# Patient Record
Sex: Female | Born: 1985
Health system: Southern US, Community
[De-identification: ages and names within clinical notes are randomized; demographics above are authoritative.]

## PROBLEM LIST (undated history)

## (undated) DIAGNOSIS — I8390 Asymptomatic varicose veins of unspecified lower extremity: Secondary | ICD-10-CM

## (undated) DIAGNOSIS — Z789 Other specified health status: Secondary | ICD-10-CM

## (undated) HISTORY — DX: Asymptomatic varicose veins of unspecified lower extremity: I83.90

## (undated) HISTORY — PX: VARICOSE VEIN SURGERY: SHX832

## (undated) HISTORY — PX: CHOLECYSTECTOMY: SHX55

---

## 2009-07-05 ENCOUNTER — Ambulatory Visit (HOSPITAL_COMMUNITY): Admission: RE | Admit: 2009-07-05 | Discharge: 2009-07-05 | Payer: Self-pay | Admitting: Obstetrics & Gynecology

## 2009-11-06 ENCOUNTER — Ambulatory Visit: Payer: Self-pay | Admitting: Obstetrics and Gynecology

## 2009-11-06 ENCOUNTER — Inpatient Hospital Stay (HOSPITAL_COMMUNITY): Admission: AD | Admit: 2009-11-06 | Discharge: 2009-11-08 | Payer: Self-pay | Admitting: Obstetrics & Gynecology

## 2010-03-06 ENCOUNTER — Emergency Department (HOSPITAL_COMMUNITY): Admission: EM | Admit: 2010-03-06 | Discharge: 2010-03-07 | Payer: Self-pay | Admitting: Emergency Medicine

## 2010-03-08 ENCOUNTER — Ambulatory Visit: Payer: Self-pay | Admitting: Internal Medicine

## 2010-03-08 ENCOUNTER — Observation Stay (HOSPITAL_COMMUNITY): Admission: EM | Admit: 2010-03-08 | Discharge: 2010-03-11 | Payer: Self-pay | Admitting: Emergency Medicine

## 2010-03-08 ENCOUNTER — Encounter (INDEPENDENT_AMBULATORY_CARE_PROVIDER_SITE_OTHER): Payer: Self-pay

## 2011-03-12 LAB — URINALYSIS, ROUTINE W REFLEX MICROSCOPIC
Bilirubin Urine: NEGATIVE
Glucose, UA: NEGATIVE mg/dL
Hgb urine dipstick: NEGATIVE
Ketones, ur: NEGATIVE mg/dL
Nitrite: NEGATIVE
Protein, ur: NEGATIVE mg/dL
Protein, ur: NEGATIVE mg/dL
Specific Gravity, Urine: 1.004 — ABNORMAL LOW (ref 1.005–1.030)
Urobilinogen, UA: 1 mg/dL (ref 0.0–1.0)

## 2011-03-12 LAB — COMPREHENSIVE METABOLIC PANEL
ALT: 152 U/L — ABNORMAL HIGH (ref 0–35)
ALT: 506 U/L — ABNORMAL HIGH (ref 0–35)
AST: 301 U/L — ABNORMAL HIGH (ref 0–37)
Albumin: 3.8 g/dL (ref 3.5–5.2)
Alkaline Phosphatase: 212 U/L — ABNORMAL HIGH (ref 39–117)
Alkaline Phosphatase: 91 U/L (ref 39–117)
BUN: 10 mg/dL (ref 6–23)
CO2: 30 mEq/L (ref 19–32)
Chloride: 106 mEq/L (ref 96–112)
Chloride: 109 mEq/L (ref 96–112)
Creatinine, Ser: 0.9 mg/dL (ref 0.4–1.2)
GFR calc Af Amer: >60 mL/min (ref >60)
GFR calc non Af Amer: >60 mL/min (ref >60)
GFR calc non Af Amer: >60 mL/min (ref >60)
Glucose, Bld: 144 mg/dL — ABNORMAL HIGH (ref 70–99)
Glucose, Bld: 85 mg/dL (ref 70–99)
Potassium: 3.5 mEq/L (ref 3.5–5.1)
Potassium: 3.8 mEq/L (ref 3.5–5.1)
Sodium: 138 mEq/L (ref 135–145)
Sodium: 142 mEq/L (ref 135–145)
Total Bilirubin: 0.7 mg/dL (ref 0.3–1.2)
Total Bilirubin: 0.7 mg/dL (ref 0.3–1.2)
Total Protein: 6.2 g/dL (ref 6.0–8.3)

## 2011-03-12 LAB — POCT PREGNANCY, URINE: Preg Test, Ur: NEGATIVE

## 2011-03-12 LAB — CBC
HCT: 34.4 % — ABNORMAL LOW (ref 36.0–46.0)
HCT: 35.1 % — ABNORMAL LOW (ref 36.0–46.0)
HCT: 37.4 % (ref 36.0–46.0)
Hemoglobin: 11.6 g/dL — ABNORMAL LOW (ref 12.0–15.0)
Hemoglobin: 12.9 g/dL (ref 12.0–15.0)
MCHC: 32.3 g/dL (ref 30.0–36.0)
MCHC: 34.4 g/dL (ref 30.0–36.0)
MCHC: 34.5 g/dL (ref 30.0–36.0)
MCV: 83.3 fL (ref 78.0–100.0)
MCV: 84.1 fL (ref 78.0–100.0)
Platelets: 133 10*3/uL — ABNORMAL LOW (ref 150–400)
RBC: 4.06 MIL/uL (ref 3.87–5.11)
RDW: 14.1 % (ref 11.5–15.5)
RDW: 14.7 % (ref 11.5–15.5)
WBC: 7.1 10*3/uL (ref 4.0–10.5)
WBC: 7.3 10*3/uL (ref 4.0–10.5)

## 2011-03-12 LAB — HEPATIC FUNCTION PANEL
AST: 248 U/L — ABNORMAL HIGH (ref 0–37)
AST: 63 U/L — ABNORMAL HIGH (ref 0–37)
Bilirubin, Direct: 0.2 mg/dL (ref 0.0–0.3)
Bilirubin, Direct: 0.3 mg/dL (ref 0.0–0.3)
Bilirubin, Direct: 2.2 mg/dL — ABNORMAL HIGH (ref 0.0–0.3)
Indirect Bilirubin: 1.3 mg/dL — ABNORMAL HIGH (ref 0.3–0.9)
Total Protein: 6.4 g/dL (ref 6.0–8.3)
Total Protein: 6.5 g/dL (ref 6.0–8.3)

## 2011-03-12 LAB — DIFFERENTIAL
Basophils Absolute: 0 10*3/uL (ref 0.0–0.1)
Basophils Relative: 0 % (ref 0–1)
Eosinophils Absolute: 0 10*3/uL (ref 0.0–0.7)
Eosinophils Relative: 1 % (ref 0–5)
Lymphs Abs: 2.5 10*3/uL (ref 0.7–4.0)
Monocytes Absolute: 0.3 10*3/uL (ref 0.1–1.0)
Monocytes Relative: 4 % (ref 3–12)
Monocytes Relative: 4 % (ref 3–12)

## 2011-03-12 LAB — POCT I-STAT, CHEM 8
BUN: 10 mg/dL (ref 6–23)
Calcium, Ion: 1.14 mmol/L (ref 1.12–1.32)
HCT: 36 % (ref 36.0–46.0)
TCO2: 24 mmol/L (ref 0–100)

## 2011-03-12 LAB — PROTIME-INR: Prothrombin Time: 14.5 seconds (ref 11.6–15.2)

## 2011-03-12 LAB — CREATININE, SERUM
Creatinine, Ser: 0.61 mg/dL (ref 0.4–1.2)
GFR calc non Af Amer: >60 mL/min (ref >60)

## 2011-03-12 LAB — LIPASE, BLOOD
Lipase: 24 U/L (ref 11–59)
Lipase: 39 U/L (ref 11–59)

## 2011-03-12 LAB — URINE MICROSCOPIC-ADD ON

## 2011-03-21 LAB — RPR: RPR Ser Ql: NONREACTIVE

## 2011-03-21 LAB — CBC
Hemoglobin: 13.6 g/dL (ref 12.0–15.0)
MCV: 84.3 fL (ref 78.0–100.0)
RBC: 4.68 MIL/uL (ref 3.87–5.11)
WBC: 10.2 10*3/uL (ref 4.0–10.5)

## 2014-02-17 ENCOUNTER — Encounter: Payer: Self-pay | Admitting: *Deleted

## 2014-03-25 ENCOUNTER — Encounter: Payer: Self-pay | Admitting: Obstetrics & Gynecology

## 2014-03-25 ENCOUNTER — Ambulatory Visit (INDEPENDENT_AMBULATORY_CARE_PROVIDER_SITE_OTHER): Payer: Self-pay | Admitting: Obstetrics & Gynecology

## 2014-03-25 VITALS — BP 114/78 | HR 75 | Temp 97.9°F | Ht 59.0 in | Wt 132.6 lb

## 2014-03-25 DIAGNOSIS — IMO0002 Reserved for concepts with insufficient information to code with codable children: Secondary | ICD-10-CM

## 2014-03-25 DIAGNOSIS — N898 Other specified noninflammatory disorders of vagina: Secondary | ICD-10-CM | POA: Insufficient documentation

## 2014-03-25 LAB — POCT URINALYSIS DIP (DEVICE)
BILIRUBIN URINE: NEGATIVE
Glucose, UA: NEGATIVE mg/dL
Ketones, ur: NEGATIVE mg/dL
LEUKOCYTES UA: NEGATIVE
NITRITE: NEGATIVE
PH: 5.5 (ref 5.0–8.0)
Protein, ur: NEGATIVE mg/dL
Specific Gravity, Urine: <=1.005 (ref 1.005–1.030)
UROBILINOGEN UA: 0.2 mg/dL (ref 0.0–1.0)

## 2014-03-25 NOTE — Patient Instructions (Signed)
Dispareunia  (Dyspareunia) La dispareunia es el dolor durante las relaciones sexuales. Es ms comn en las mujeres, pero tambin ocurre en los hombres.  CAUSAS  Femenino El dolor se siente cuando algo penetra en la vagina, pero cualquier parte de los genitales pueden causar dolor durante las relaciones sexuales. Puede sentir dolor incluso al sentarse o usar pantalones. En algunos casos no se conoce la causa. Algunas causas:   Infecciones de la piel que rodea la vagina.  Las infecciones vaginales, tales como hongos, bacterias o infeccin viral.  Vaginismo. Es la imposibilidad de tener algo en la vagina, incluso queriendo hacerlo. Hay una contraccin muscular automtica y dolor. El dolor de la contraccin muscular puede ser tan intenso que el coito es imposible.  Reaccin alrgica a los espermicidas, semen, preservativos, tampones perfumados, jabones, duchas y aerosoles vaginales.  Un saco lleno de lquido (quiste) en las glndulas de Bartolino o de Skene, que se encuentra en la apertura de la vagina.  Tejido cicatrizal en la vagina por un agrandamiento quirrgico de la abertura (episiotoma) o desgarro despus de tener un beb.  Sequedad vaginal. Esto es ms frecuente en la menopausia. Las secreciones normales de la vagina estn disminuidas. Los cambios en los niveles de estrgeno y la mayor dificultad para excitarse pueden causar dolor durante el sexo. La sequedad vaginal tambin puede ocurrir cuando se toman pldoras anticonceptivas.  Adelgazamiento del tejido (atrofia) de la vulva y la vagina. Esto hace que la zona sea ms delgada, ms pequea, incapaz de estirarse para acomodar el pene, y propensa a infecciones y desgarro.  Vestibulitis vulvar o vestibulodinia. Esta es una enfermedad que causa dolor y afecta el rea alrededor de la entrada de la vagina. La causa ms comn en las mujeres jvenes son las pldoras anticonceptivas. Las mujeres con niveles bajos de estrgenos (mujeres  posmenopusicas) tambin pueden experimentarlo.Otras causas incluyen reacciones alrgicas, gran cantidad de terminaciones nerviosas, afecciones de la piel y msculos plvicos que no pueden relajarse.  Dermatosis vulvar. Esto incluye enfermedades de la piel como el liquen escleroso y liquen plano.  Falta de juegos previos para la lubricacin de la vagina. Esto puede causar sequedad vaginal.  Los tumores no cancerosos (fibromas) en el tero.  El tejido que reviste internamente al tero se desarrolla fuera del mismo (endometriosis).  Embarazo que comienza en la trompa de Falopio (embarazo tubrico).  El embarazo o estar amamantando al beb. Esto puede causar sequedad vaginal.  Inclinacin o prolapso del tero. El prolapso se produce cuando los msculos dbiles y estirados alrededor del tero dejan que caiga en la vagina.  Problemas en los ovarios, quistes o cicatrices. Esto puede ser peor con ciertas posiciones sexuales.  Cirugas previas causando adherencias o tejido cicatrizal en la vagina o la pelvis.  Problemas en la vejiga e intestinales.  Problemas psicolgicos (como depresin o ansiedad). Esto puede hacer que el dolor empeore.  Actitudes negativas con respecto al sexo, haber sufrido una violacin, agresin sexual, y desinformacin sobre el sexo. Estos problemas suelen estar relacionados con algunos tipos de dolor.  Infeccin plvica previa, causando un tejido cicatrizal en la pelvis y en los rganos femeninos.  Quiste o tumor en el ovario.  Cncer en los rganos femeninos.  Ciertos medicamentos.  Enfermedades como diabetes, artritis, o enfermedad de la tiroides. Masculino En los hombres, hay muchas causas fsicas del malestar sexual. Algunas causas:   Infecciones de la prstata, la vejiga, o las vesculas seminales. Pueden causar dolor despus de la eyaculacin.  Inflamacin de la vejiga (cistitis   intersticial). Puede causar dolor durante la eyaculacin.  Infecciones  por gonorrea. Puede causar dolor durante la eyaculacin.  Inflamacin de la uretra (uretritis) o inflamacin de la prstata (prostatitis) . Puede hacer que la estimulacin genital sea dolorosa o incmoda.  Deformidades del pene como la enfermedad de Peyronie.  Un prepucio estrecho.  Cncer en los rganos reproductivos masculinos.  Problemas psiclogicos. Esto puede hacer que el dolor empeore. DIAGNSTICO   El mdico har la historia clnica y tendr que describir el lugar donde se localiza el dolor (fuera de la vagina, en la vagina, en la pelvis). Le preguntar en qu momento experimenta el dolor, durante la penetracin o con los choques.  Luego el mdico le har un examen fsico. Hgale saber si sufre dolor durante el examen.  Durante la parte final del examen femenino, su mdico le palpar el tero y los ovarios con la mano sobre el abdomen y un dedo en la vagina. Es un examen plvico.  Le pedir anlisis de sangre, un Papanicolaou, cultivos en busca de infecciones, una ecografa y radiografas. Es posible que necesite ver a un especialista por problemas femeninos (gineclogo).  Su mdico indicar que se haga una tomografa computada, una resonancia magntica o una laparoscopia. La laparoscopia es un procedimiento para examinar la pelvis con un tubo con luz, a travs de un corte (incisin) en el abdomen. TRATAMIENTO  Su mdico determinar el mejor curso de tratamiento. En algunos casos se solicitan ms pruebas. Contine con los estudios indicados hasta que el mdico est seguro acerca del diagnstico y el tratamiento. A veces, es difcil de encontrar la causa del dolor. La bsqueda de la causa y el tratamiento pueden ser frustrantes. El tratamiento generalmente demora varias semanas o meses antes de notar alguna mejora. Tambin puede ser necesario evitar la actividad sexual hasta que los sntomas mejoren. Si sigue teniendo relaciones sexuales cuando tiene dolor puede retrasar la curacin y  empeorar el problema.  El tratamiento depende de la causa del dolor. Puede incluir:   Medicamentos como antibiticos, cremas vaginales, para la piel, hormonas, o antidepresivos.  Ciruga mayor o menor.  El asesoramiento psicolgico o terapia de grupo.  Ejercicios de Kegel y dilatadores vaginales para ayudar en ciertos casos de vaginismo (espasmos). Hgalos slo si se lo recomienda su mdico. Los ejercicios Kegel pueden hacer que algunos problemas empeoren.  La aplicacin de un lubricante segn las indicaciones del mdico si tiene sequedad.  Terapia sexual para usted y su compaero. Es comn que el dolor contine despus de tratar la causa. Puede ser por una respuesta condicionada. Esto significa la persona se vuelve tan familiar con el dolor que persiste como respuesta, aunque se elimine la causa. La terapia sexual puede ayudar con este problema.  INSTRUCCIONES PARA EL CUIDADO EN EL HOGAR   Siga las instrucciones de su mdico acerca de como tomar los medicamentos, las pruebas, terapias y visitas de control.  No use tampones, duchas vaginales , aerosoles vaginales o jabones perfumados.  Use lubricantes a base de agua para la sequedad. Los lubricantes con aceites pueden causar irritacin.  No utilice espermicidas o condones que le irriten.  Hable abiertamente con su pareja sobre su experiencia sexual , sus deseos, el juego previo y las diferentes posiciones sexuales para una relacin sexual ms cmoda y agradable.  nase a sesiones de terapia de grupo, si es necesario.  Practique sexo seguro en todo momento.  Vace su vejiga antes de tener relaciones sexuales.  Pruebe diferentes posiciones durante el coito.  Tome   medicamentos de venta libre para el dolor segn las indicaciones del mdico, antes de tener relaciones sexuales.  No use panties. Use las medias que llegan a la altura de la rodilla o del muslo.  Evite frotar la vulva con un pao. Lave suavemente la zona y squela con  una toalla dando golpecitos. SOLICITE ATENCIN MDICA SI:   Tiene hemorragias durante las relaciones sexuales.  Observa un bulto en la abertura de la vagina, aunque no sea doloroso.  Tiene flujo vaginal anormal.  Tiene sequedad vaginal.  Siente tiene picazn o irritacin de la vulva o la vagina.  Aparece una erupcin o tiene una reaccin a los medicamentos. SOLICITE ATENCIN MDICA DE INMEDIATO SI:   Siente dolor abdominal intenso durante o poco despus de las relaciones sexuales. Usted podra haber sufrido la ruptura de un quiste ovrico o un embarazo ectpico.  Tiene fiebre.  Comienza a sentir dolor al orinar u observa sangre.  Tiene relaciones sexuales dolorosas, y nunca le ocurri antes.  Se desmaya despus de tener relaciones sexuales. Document Released: 08/15/2011 Document Revised: 02/25/2012 ExitCare Patient Information 2014 Exit[employer].  

## 2014-03-25 NOTE — Progress Notes (Signed)
Patient ID: Linda DikesAnalberta Duncan, female   DOB: [birth year], 28 y.o.   MRN: [id number]  Chief Complaint  Patient presents with  . [facility]; everything started 6 months ago   . Dysuria  . Urinary Retention    difficult to urinate at times  . Dyspareunia    sometimes    HPI Linda Duncan is a 28 y.o. female.  [id number] Patient's last menstrual period was 03/22/2014. [facility], frequency with negative urine, vaginal cyst 1.5 cm seen on exam at [facility].  HPI  History reviewed. No pertinent past medical history.  Past Surgical History  Procedure Laterality Date  . Cholecystectomy      History reviewed. No pertinent family history.  Social History History  Substance Use Topics  . Smoking status: Never Smoker   . Smokeless tobacco: Never Used  . Alcohol Use: No    No Known Allergies  No current outpatient prescriptions on file.   No current facility-administered medications for this visit.    Review of Systems Review of Systems  Constitutional: Negative.   Genitourinary: Positive for dysuria, frequency, vaginal bleeding, vaginal pain and dyspareunia. Negative for vaginal discharge.    Blood pressure 114/78, pulse 75, temperature 97.9 F (36.6 C), temperature source Oral, height 4\' 11"  (1.499 m), weight 132 lb 9.6 oz (60.147 kg), last menstrual period 03/22/2014.  Physical Exam Physical Exam  Constitutional: She is oriented to person, place, and time. She appears well-developed. No distress.  Pulmonary/Chest: Effort normal. No respiratory distress.  Genitourinary: No vaginal discharge found.  Uterus retroverted mild tender no mass, 1.5 cm anterior vaginal inclusion cyst sl tender  Neurological: She is alert and oriented to person, place, and time.  Skin: Skin is warm and dry.  Psychiatric: She has a normal mood and affect. Her behavior is normal.    Data Reviewed Referral note  Assessment    Patient Active Problem  List   Diagnosis Date Noted  . Vaginal inclusion cyst 03/25/2014  . Dyspareunia 03/25/2014        Plan    Pelvic US RTC to review and consider I&D cyst with local and cautery AgNO3        Linda Duncan 03/25/2014, 4:26 PM

## 2014-03-31 ENCOUNTER — Ambulatory Visit (HOSPITAL_COMMUNITY)
Admission: RE | Admit: 2014-03-31 | Discharge: 2014-03-31 | Disposition: A | Discharge status: Home / Self Care | Payer: Self-pay | Source: Ambulatory Visit | Attending: Obstetrics & Gynecology | Admitting: Obstetrics & Gynecology

## 2014-03-31 DIAGNOSIS — N750 Cyst of Bartholin's gland: Secondary | ICD-10-CM | POA: Insufficient documentation

## 2014-03-31 DIAGNOSIS — N898 Other specified noninflammatory disorders of vagina: Secondary | ICD-10-CM

## 2014-03-31 DIAGNOSIS — IMO0002 Reserved for concepts with insufficient information to code with codable children: Secondary | ICD-10-CM | POA: Insufficient documentation

## 2014-03-31 DIAGNOSIS — N9489 Other specified conditions associated with female genital organs and menstrual cycle: Secondary | ICD-10-CM | POA: Insufficient documentation

## 2014-04-19 ENCOUNTER — Ambulatory Visit: Payer: Self-pay | Attending: Internal Medicine

## 2014-04-24 IMAGING — US US TRANSVAGINAL NON-OB
1 series · 14 of 25 positions shown · non-contrast
Comparison: None

CLINICAL DATA: Dyspareunia



[Series 1: us pelvis complete · 14 of 63 slices shown]
[im 1/63]
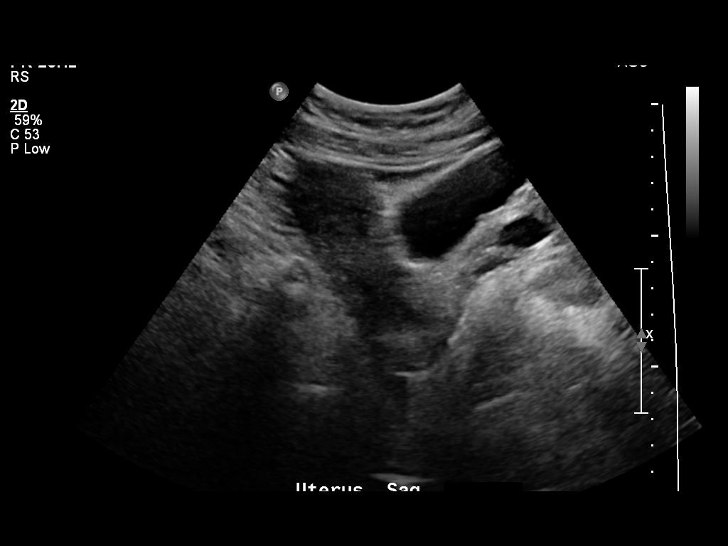
[im 6/63]
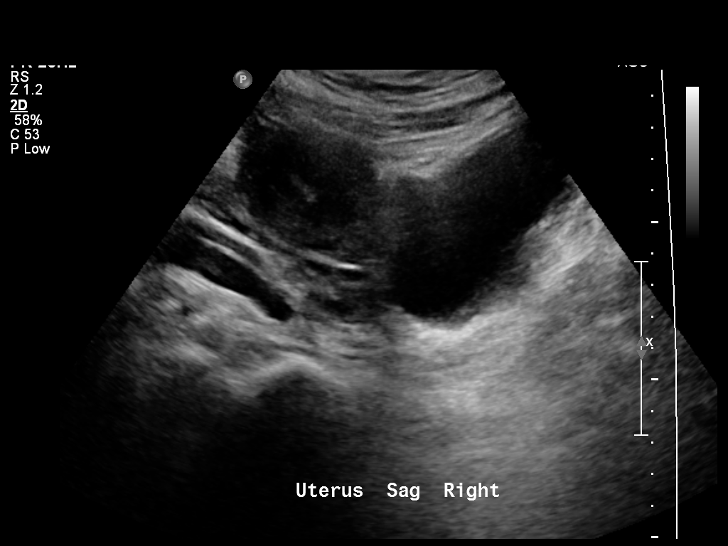
[im 11/63]
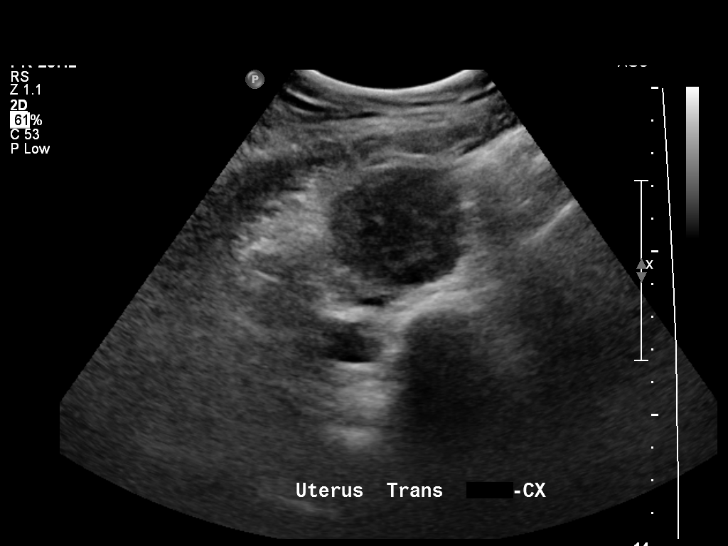
[im 16/63]
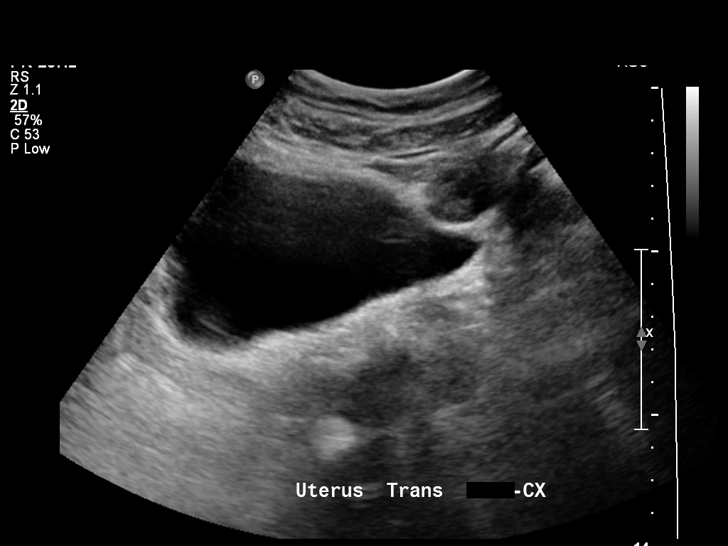
[im 21/63]
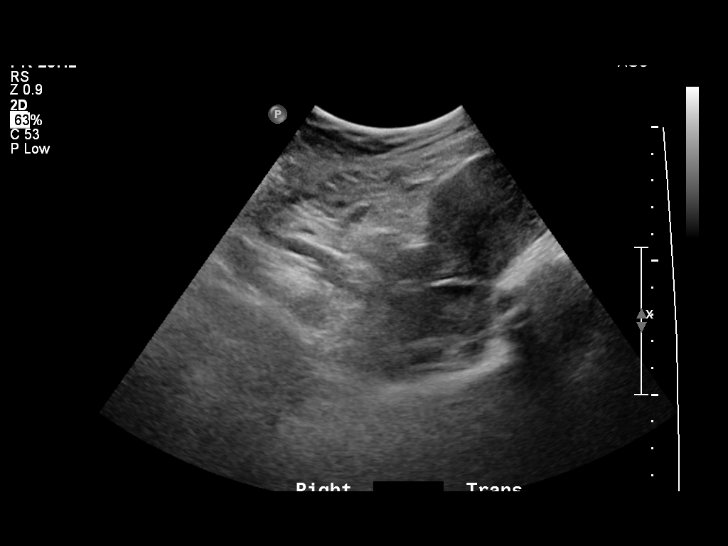
[im 24/63]
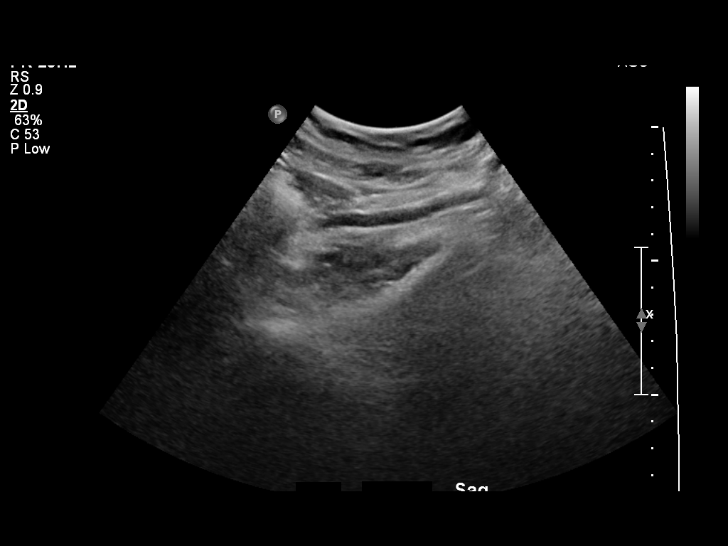
[im 29/63]
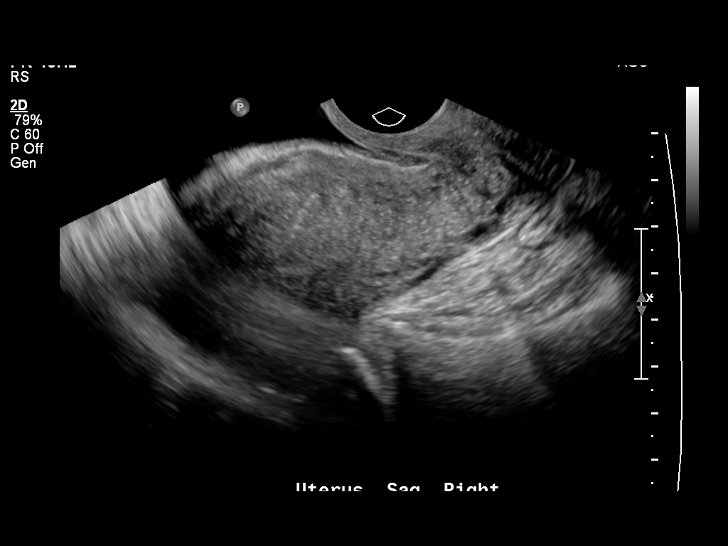
[im 34/63]
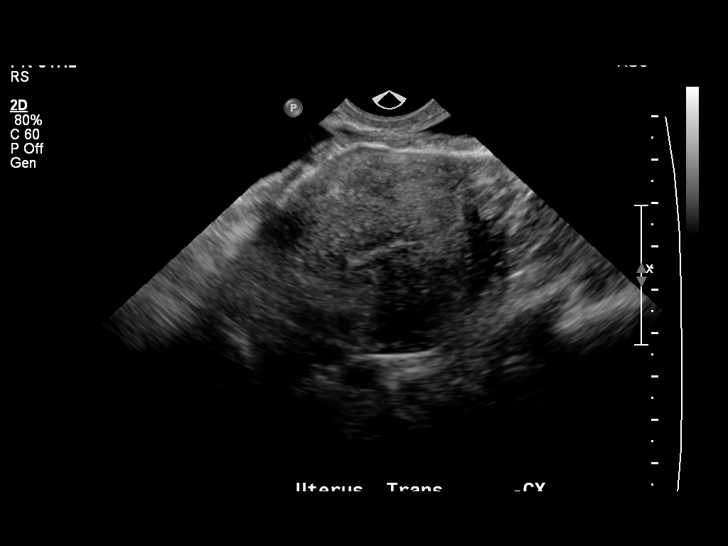
[im 39/63]
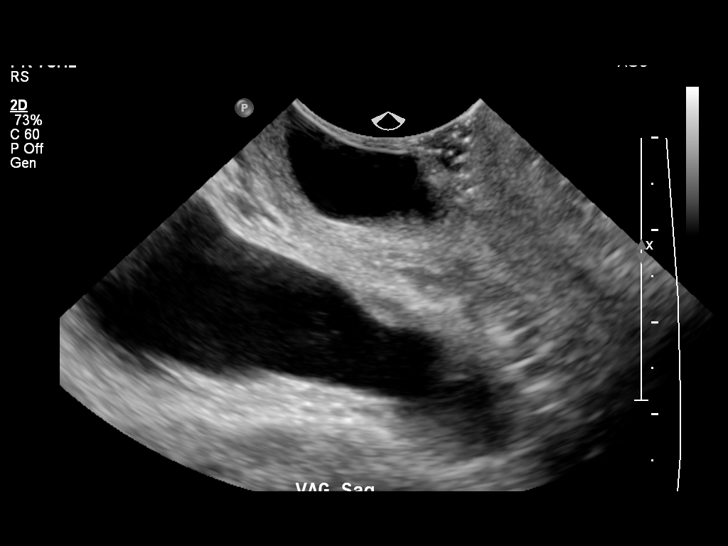
[im 42/63]
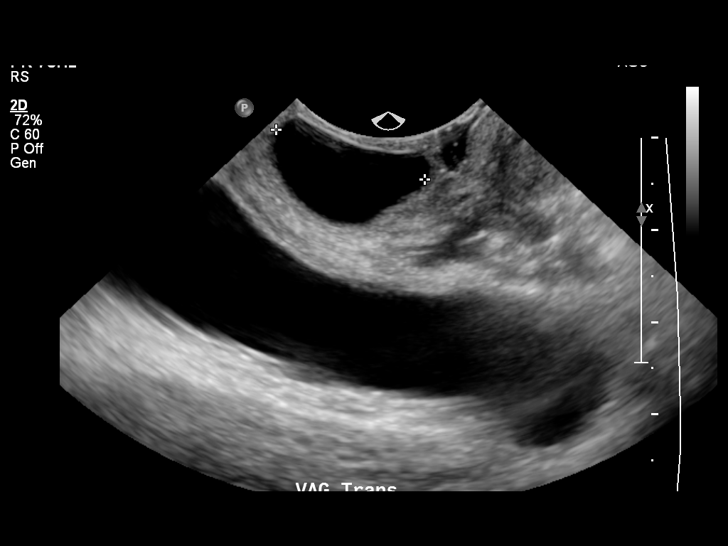
[im 47/63]
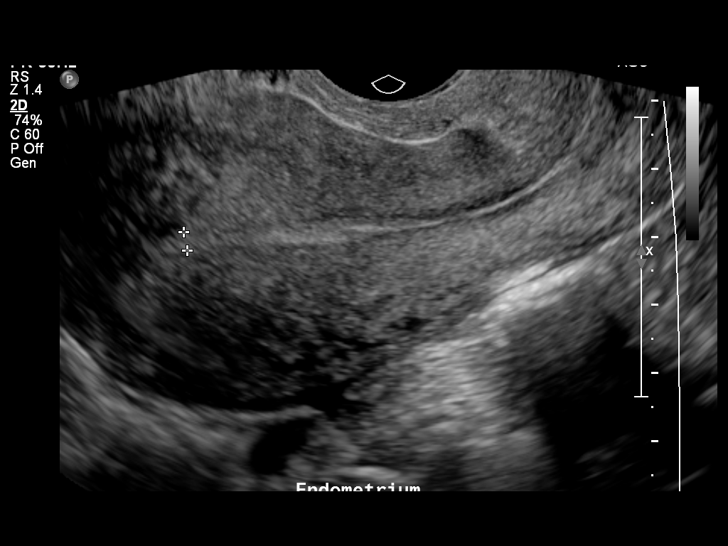
[im 52/63]
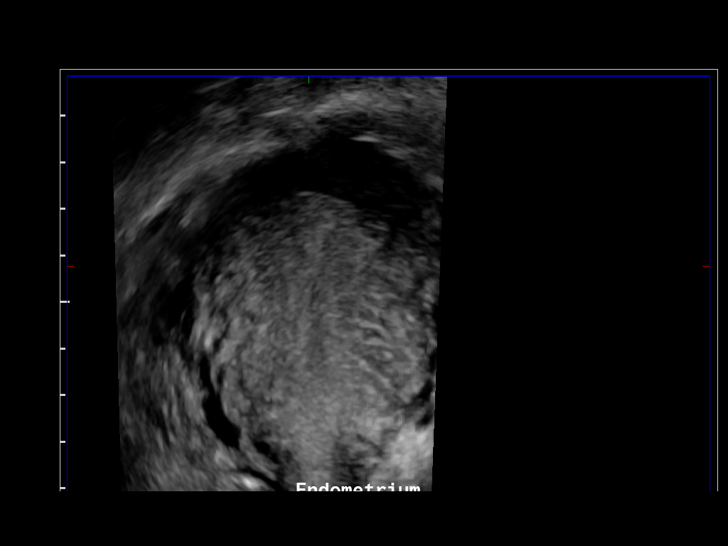
[im 57/63]
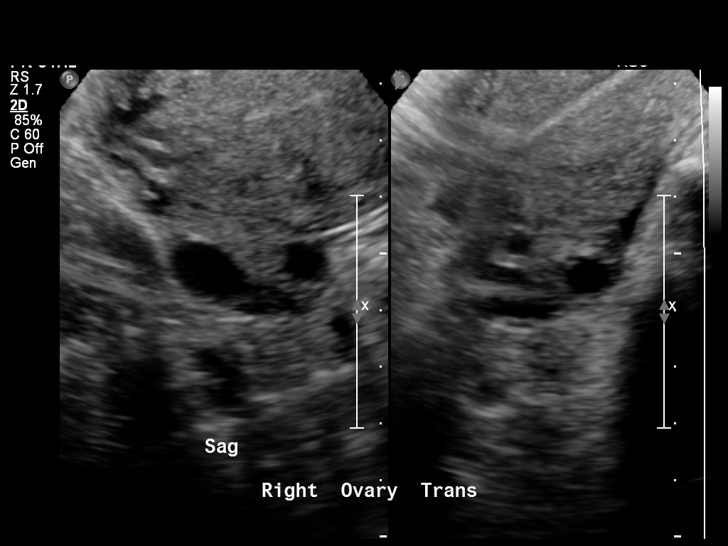
[im 63/63]
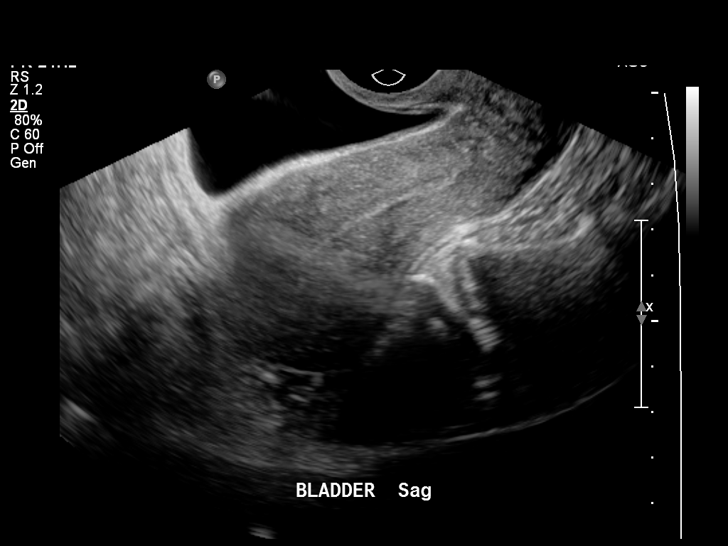

[14 of 25 positions shown; findings below may reference images not displayed]

FINDINGS: Uterus

Measurements: 8.8 x 4.8 x 5.5 cm. No fibroids or other mass
visualized.

Endometrium

Thickness: 4 mm in thickness.  No focal abnormality visualized.

Right ovary

Measurements: 3.8 x 1.8 x 2.8 cm. Normal appearance/no adnexal mass.

Left ovary

Measurements: 2.6 x 1.5 x 2.1 cm. Normal appearance/no adnexal mass.

Other findings

No free fluid.  2.2 cm simple appearing cyst noted within the vagina
IMPRESSION: Probable Bartholin cyst or vaginal inclusion cyst, 2.2 cm. Otherwise
unremarkable study.

## 2014-04-29 ENCOUNTER — Ambulatory Visit (INDEPENDENT_AMBULATORY_CARE_PROVIDER_SITE_OTHER): Payer: Self-pay | Admitting: Obstetrics & Gynecology

## 2014-04-29 VITALS — BP 107/74 | Temp 97.8°F | Ht <= 58 in | Wt 136.7 lb

## 2014-04-29 DIAGNOSIS — N898 Other specified noninflammatory disorders of vagina: Secondary | ICD-10-CM

## 2014-04-29 NOTE — Progress Notes (Signed)
Patient ID: Linda Duncan, female   DOB: 09-23-1986, 28 y.o.   MRN: [id number] Patient's last menstrual period was 04/24/2014. Currently menstruating, wants to defer exam and I&D vaginal cyst until no bleeding.       CLINICAL DATA: Dyspareunia  EXAM:  TRANSABDOMINAL AND TRANSVAGINAL ULTRASOUND OF PELVIS  TECHNIQUE:  Both transabdominal and transvaginal ultrasound examinations of the  pelvis were performed. Transabdominal technique was performed for  global imaging of the pelvis including uterus, ovaries, adnexal  regions, and pelvic cul-de-sac. It was necessary to proceed with  endovaginal exam following the transabdominal exam to visualize the  endometrium.  COMPARISON: None  FINDINGS:  Uterus  Measurements: 8.8 x 4.8 x 5.5 cm. No fibroids or other mass  visualized.  Endometrium  Thickness: 4 mm in thickness. No focal abnormality visualized.  Right ovary  Measurements: 3.8 x 1.8 x 2.8 cm. Normal appearance/no adnexal mass.  Left ovary  Measurements: 2.6 x 1.5 x 2.1 cm. Normal appearance/no adnexal mass.  Other findings  No free fluid. 2.2 cm simple appearing cyst noted within the vagina  IMPRESSION:  Probable Bartholin cyst or vaginal inclusion cyst, 2.2 cm. Otherwise  unremarkable study.  Electronically Signed  By: Linda NoseKevin Dover M.D.  On: 03/31/2014 11:20   Imp Vagina inclusion cyst   Plan RTC for I&D  04/29/2014 Linda PhenixJames G Clark Clowdus, MD

## 2014-05-21 ENCOUNTER — Ambulatory Visit (INDEPENDENT_AMBULATORY_CARE_PROVIDER_SITE_OTHER): Payer: Self-pay | Admitting: Medical

## 2014-05-21 ENCOUNTER — Encounter: Payer: Self-pay | Admitting: Medical

## 2014-05-21 VITALS — BP 109/71 | HR 75 | Temp 98.4°F | Ht 60.0 in | Wt 138.9 lb

## 2014-05-21 DIAGNOSIS — I83893 Varicose veins of bilateral lower extremities with other complications: Secondary | ICD-10-CM

## 2014-05-21 DIAGNOSIS — I83819 Varicose veins of unspecified lower extremities with pain: Secondary | ICD-10-CM

## 2014-05-21 NOTE — Patient Instructions (Signed)
Quiste epidérmico   (Epidermal Cyst)  Un quiste epidérmico se denomina también quiste sebáceo, quiste de inclusión epidérmica o quiste infundibular. Estos quistes contienen una sustancia "pastosa" o similar al "queso" y puede tener mal olor. Esta sustancia es una proteína denominada keratina. Estos quistes generalmente se forman en el rostro, el cuello o el tronco. También pueden aparecer en la zona vaginal u otras partes de los genitales, tanto en hombres como en mujeres. En general son pequeños bultos indoloros, que crecen lentamente y que se mueven libremente debajo de la piel. Es importante no tratar de apretarlos para extraer la sustancia que contienen. Esto puede ocasionar una infección que origine dolor e hinchazón en el área.   CAUSAS   La causa del puede ser una lesión penetrante profunda o un folículo piloso obstruido, generalmente asociado al acné.   SÍNTOMAS   Los quistes epidermicos pueden inflamarse y causar:   · Enrojecimiento.  · Sensibilidad.  · Aumento de la temperatura en la zona.  · Material que drena de color blanco grisáceo, consistente y de olor desagradable.  DIAGNÓSTICO  Generalmente estas infecciones son diagnosticadas por el profesional durante el examen físico. En raras ocasiones será necesario realizar una biopsia para descartar otros trastornos que parezcan ser similares.   TRATAMIENTO  · Generalmente mejoran y desaparecen sin tratamiento. No suelen ser peligrosos.  · Pueden inflamarse y sensibilizarse si se infectan. Esto puede requerir la apertura y drenaje del quiste. Podrá ser necesaria la administración de antibióticos. Cuando la infección haya desaparecido, el quiste podrá eliminarse con una cirugía menor.  · Los pequeños quistes inflamados generalmente pueden tratarse inyectado corticoides con los antibióticos.  · En algunos casos el quiste se agranda y puede ser una preocupación. Si esto ocurre, es necesario extirparlo quirúrgicamente en el consultorio del  profesional.  INSTRUCCIONES PARA EL CUIDADO EN EL HOGAR   · Tome sólo medicamentos de venta libre o recetados, según las indicaciones del médico.  · Tome los antibióticos como se le indicó. Tómelos todos, aunque se sienta mejor.  SOLICITE ATENCIÓN MÉDICA SI:   · Siente dolor, observa enrojecimiento o hinchazón.  · El problema no mejora, o empeora.  · Tiene preguntas o preocupaciones.  ASEGÚRESE DE QUE:   · Comprende estas instrucciones.  · Controlará su enfermedad.  · Solicitará ayuda de inmediato si no mejora o si empeora.  Document Released: 01/14/2007 Document Revised: 02/25/2012  ExitCare® Patient Information ©2014 ExitCare, LLC.

## 2014-05-21 NOTE — Progress Notes (Signed)
Patient ID: Linda Duncan, female   DOB: 10-Jan-1986, 28 y.o.   MRN: [id number]  History:  Linda Duncan  is a 28 y.o. [phone] who presents to clinic today for a vaginal inclusion cyst. The patient denies pain, discharge, bleeding or fever. She is unsure of any change in size since last visit. Patient is also concerned about pain in varicose veins. She has had these varicosities since her first pregnancy and pain x months.   The following portions of the patient's history were reviewed and updated as appropriate: allergies, current medications, past family history, past medical history, past social history, past surgical history and problem list.  Review of Systems:  Pertinent items are noted in HPI.  Objective:  Physical Exam BP 109/71  Pulse 75  Temp(Src) 98.4 F (36.9 C) (Oral)  Ht 5' (1.524 m)  Wt 138 lb 14.4 oz (63.005 kg)  BMI 27.13 kg/m2  LMP 04/24/2014 GENERAL: Well-developed, well-nourished female in no acute distress.  HEENT: Normocephalic, atraumatic.  LUNGS: Normal effort HEART: Regular rate  ABDOMEN: Soft, nontender, nondistended. No organomegaly. Normal bowel sounds appreciated in all quadrants.  PELVIC: Normal external female genitalia. Vagina is pink and rugated.  Normal discharge. Normal cervix contour. Patient has a moderately size inclusion cyst on the ventral vaginal wall. No erythema, edema or drainage noted.  EXTREMITIES: No cyanosis, clubbing, or edema. Few prominent varicose veins noted popliteal region. No evidence of DVT  MDM No MD in clinic today. Attempted to consult Dr. Debroah Loop covering the house and he was in surgery. Since there is no evidence of infection and patient denies pain, will reschedule once I have discussed with Dr. Debroah Loop. No charge for today's visit.   Assessment & Plan:  Assessment: Vaginal inclusion cyst Varicose veins  Plans: Ambulatory referral to [facility] sent for patient to follow-up about varicose veins as patient  cannot afford vein specialist at this time Will consult MD and decide if I&D can be performed in clinic Patient will be contacted with a plan and new appointment if necessary  Freddi Starr, PA-C 05/21/2014 10:14 AM

## 2014-05-27 ENCOUNTER — Telehealth: Payer: Self-pay | Admitting: *Deleted

## 2014-05-27 DIAGNOSIS — B3731 Acute candidiasis of vulva and vagina: Secondary | ICD-10-CM

## 2014-05-27 DIAGNOSIS — B373 Candidiasis of vulva and vagina: Secondary | ICD-10-CM

## 2014-05-27 MED ORDER — FLUCONAZOLE 150 MG PO TABS: 150.0000 mg | ORAL_TABLET | Freq: Every day | ORAL | Status: DC

## 2014-05-27 NOTE — Telephone Encounter (Signed)
Patient called and stated that she has vaginal itching and burning, some discharge. Pt states that this is like when she had a yeast infection before. Will send diflucan per protocol to her pharmacy. Patient agrees.

## 2014-07-01 ENCOUNTER — Ambulatory Visit (INDEPENDENT_AMBULATORY_CARE_PROVIDER_SITE_OTHER): Payer: Self-pay | Admitting: Obstetrics & Gynecology

## 2014-07-01 ENCOUNTER — Encounter: Payer: Self-pay | Admitting: Obstetrics & Gynecology

## 2014-07-01 VITALS — BP 116/83 | HR 82 | Temp 97.5°F | Wt 135.1 lb

## 2014-07-01 DIAGNOSIS — N898 Other specified noninflammatory disorders of vagina: Secondary | ICD-10-CM

## 2014-07-01 NOTE — Patient Instructions (Addendum)
Extirpacin de un quiste (Cyst Removal) El profesional que lo asiste le ha extirpado un quiste. Un quiste es una cavidad que contiene material semilquido. Puede aparecer en cualquier lugar del cuerpo. Un quiste puede permanecer pequeo durante aos o aumentar de tamao gradualmente. Un quiste sebceo es un glndula sudorpara dilatada (que se [place]anda), y que se llena con secrecin sebcea (sudor). Si no se trata, puede agrandarse (hasta el tamao de una pelota de softball) con el paso de [place] aos. Generalmente son extirpados por motivos cosmticos (mejorar la apariencia) o antes de que se infecte para formar un absceso. Un absceso es una estructura qustica llena de pus. INSTRUCCIONES PARA EL CUIDADO DOMICILIARIO  Mantenga el vendaje, limpio y seco. Puede cambiarlo despus de 24 horas. Si el vendaje se adhiere, use agua tibia para aflojarlo suavemente. Seque la zona dando pequeos golpes con una toalla limpia antes de colocarse otro vendaje.  En lo posible, mantenga elevada la zona en la que fue extirpado el quiste para aliviar la hinchazn, Chief Technology Officerel dolor y Biochemist, clinicalfavorecer la curacin.  Si tiene una sutura, mantngala limpia y [place].  Puede limpiar la sutura suavemente con un hisopo de algodn mojado en agua jabonosa tibia.  No remoje la zona en la que fue extirpado el quiste ni practique natacin. Puede ducharse.  No utilice demasiado la zona en la que fue extirpado el quiste.  Regrese dentro de [address], o cuando se lo indiquen para Oceanographerretirar los puntos de la sutura.  Tome los United Parcelmedicamentos como le indic el mdico. SOLICITE ATENCIN MDICA DE INMEDIATO SI:  La temperatura oral se eleva sin motivo por encima de 38,9 C (102 F) y no puede controlarla con los medicamentos, o segn le indique el profesional que lo asiste.  La sangre sigue mojando el vendaje.  Aumenta el dolor en la zona en la que fue extirpado el [place].  Presenta enrojecimiento, hinchazn, pus, mal olor, inflamacin (irritacin) observa  rayas rojas que salen de la sutura. Estos son signos de infeccin. EST SEGURO QUE:   Comprende las instrucciones para el alta mdica.  Controlar su enfermedad.  Solicitar atencin mdica de inmediato segn las indicaciones. Document Released: 09/12/2005 Document Revised: 12/08/2013 [facility] Patient Information 2015 [employer], [place]. This information is not intended to replace advice given to you by your health care provider. Make sure you discuss any questions you have with your health care provider.   [facility] [phone]

## 2014-07-01 NOTE — Progress Notes (Signed)
Subjective:     Patient ID: Linda Duncan, female   DOB: Jan 22, 1986, 28 y.o.   MRN: [id number]  HPI Pt c/o painful cyst in vagina.  She reports that it makes her feel that she has to void at all times.  She was initially told that it could be drained in the ofc but now 2 physician have said that it is not possible.     Review of Systems     Objective:   Physical Exam BP 116/83  Pulse 82  Temp(Src) 97.5 F (36.4 C) (Oral)  Wt 135 lb 1.6 oz (61.281 kg) Pt in NAD  GU: EGBUS: no lesions Vagina: inclusion cyst just under the urethra.  ~2-3 cm Cervix: no lesion; no mucopurulent d/c        Assessment:     Suburethral inclusion cyst- discussed with pt that a simple drainage could be done in the ofc but would simply return. Rec resection     Plan:     Patient desires surgical management with resection of suburethral inclusion cyst.  The risks of surgery were discussed in detail with the patient including but not limited to: bleeding which may require transfusion or reoperation; infection which may require prolonged hospitalization or re-hospitalization and antibiotic therapy; injury to bowel, bladder, urethra and major vessels or other surrounding organs; need for additional procedures including laparotomy; thromboembolic phenomenon, incisional problems and other postoperative or anesthesia complications.  Patient was told that the likelihood that her condition and symptoms will be treated effectively with this surgical management was very high; the postoperative expectations were also discussed in detail. The patient also understands the alternative treatment options which were discussed in full. All questions were answered.  She was told that she will be contacted by our surgical scheduler regarding the time and date of her surgery; routine preoperative instructions of having nothing to eat or drink after midnight on the day prior to surgery and also coming to the hospital 1 1/2 hours  prior to her time of surgery were also emphasized.  She was told she may be called for a preoperative appointment about a week prior to surgery and will be given further preoperative instructions at that visit. Printed patient education handouts about the procedure were given to the patient to review at home.

## 2014-07-20 ENCOUNTER — Encounter (HOSPITAL_COMMUNITY): Payer: Self-pay | Admitting: Pharmacist

## 2014-07-29 ENCOUNTER — Encounter (HOSPITAL_COMMUNITY): Payer: Self-pay

## 2014-07-29 ENCOUNTER — Encounter (HOSPITAL_COMMUNITY)
Admission: RE | Admit: 2014-07-29 | Discharge: 2014-07-29 | Disposition: A | Discharge status: Home / Self Care | Payer: Self-pay | Source: Ambulatory Visit | Attending: Obstetrics & Gynecology | Admitting: Obstetrics & Gynecology

## 2014-07-29 DIAGNOSIS — Z01812 Encounter for preprocedural laboratory examination: Secondary | ICD-10-CM | POA: Insufficient documentation

## 2014-07-29 HISTORY — DX: Other specified health status: Z78.9

## 2014-07-29 LAB — CBC
HCT: 40.6 % (ref 36.0–46.0)
HEMOGLOBIN: 14.1 g/dL (ref 12.0–15.0)
MCH: 29.9 pg (ref 26.0–34.0)
MCHC: 34.7 g/dL (ref 30.0–36.0)
MCV: 86.2 fL (ref 78.0–100.0)
PLATELETS: 223 10*3/uL (ref 150–400)
RBC: 4.71 MIL/uL (ref 3.87–5.11)
RDW: 12.9 % (ref 11.5–15.5)
WBC: 6.4 10*3/uL (ref 4.0–10.5)

## 2014-07-29 NOTE — Patient Instructions (Signed)
Your procedure is scheduled on:08/03/14  Enter through the Main Entrance at : 9:00am Pick up desk phone and dial [phone] and inform us of your arrival.  Please call [phone] if you have any problems the morning of surgery.  Remember: Do not eat food or drink liquids, including water, after midnight:Monday   You may brush your teeth the morning of surgery.    DO NOT wear jewelry, eye make-up, lipstick,body lotion, or dark fingernail polish.  (Polished toes are ok) You may wear deodorant.  If you are to be admitted after surgery, leave suitcase in car until your room has been assigned. Patients discharged on the day of surgery will not be allowed to drive home. Wear loose fitting, comfortable clothes for your ride home.

## 2014-07-29 NOTE — Pre-Procedure Instructions (Signed)
Pt interviewed for health data with [facility] as interpreter.

## 2014-08-02 ENCOUNTER — Encounter (HOSPITAL_COMMUNITY): Payer: Self-pay | Admitting: Anesthesiology

## 2014-08-03 ENCOUNTER — Encounter (HOSPITAL_COMMUNITY): Admission: RE | Payer: Self-pay | Source: Ambulatory Visit

## 2014-08-03 ENCOUNTER — Ambulatory Visit (HOSPITAL_COMMUNITY): Admission: RE | Admit: 2014-08-03 | Payer: Self-pay | Source: Ambulatory Visit | Admitting: Obstetrics & Gynecology

## 2014-08-03 SURGERY — COLPORRHAPHY, ANTERIOR, FOR CYSTOCELE REPAIR
Anesthesia: Choice | Site: Vagina

## 2014-08-17 ENCOUNTER — Ambulatory Visit (HOSPITAL_COMMUNITY): Payer: Self-pay | Admitting: Anesthesiology

## 2014-08-17 ENCOUNTER — Encounter (HOSPITAL_COMMUNITY): Payer: Self-pay

## 2014-08-17 ENCOUNTER — Encounter (HOSPITAL_COMMUNITY)
Admission: RE | Disposition: A | Discharge status: Home / Self Care | Payer: Self-pay | Source: Ambulatory Visit | Attending: Obstetrics & Gynecology

## 2014-08-17 ENCOUNTER — Ambulatory Visit (HOSPITAL_COMMUNITY)
Admission: RE | Admit: 2014-08-17 | Discharge: 2014-08-17 | Disposition: A | Discharge status: Home / Self Care | Payer: Self-pay | Source: Ambulatory Visit | Attending: Obstetrics & Gynecology | Admitting: Obstetrics & Gynecology

## 2014-08-17 ENCOUNTER — Encounter (HOSPITAL_COMMUNITY): Payer: Self-pay | Admitting: Anesthesiology

## 2014-08-17 DIAGNOSIS — IMO0002 Reserved for concepts with insufficient information to code with codable children: Secondary | ICD-10-CM

## 2014-08-17 DIAGNOSIS — N398 Other specified disorders of urinary system: Secondary | ICD-10-CM | POA: Insufficient documentation

## 2014-08-17 DIAGNOSIS — N368 Other specified disorders of urethra: Secondary | ICD-10-CM

## 2014-08-17 HISTORY — PX: URETHRAL CYST REMOVAL: SHX5128

## 2014-08-17 LAB — PREGNANCY, URINE: PREG TEST UR: NEGATIVE

## 2014-08-17 SURGERY — EXCISION, CYST, PERIURETHRAL
Anesthesia: Monitor Anesthesia Care | Site: Vagina

## 2014-08-17 MED ORDER — KETOROLAC TROMETHAMINE 30 MG/ML IJ SOLN
INTRAMUSCULAR | Status: AC
  Filled 2014-08-17: qty 1

## 2014-08-17 MED ORDER — OXYCODONE-ACETAMINOPHEN 5-325 MG PO TABS: 1.0000 | ORAL_TABLET | Freq: Four times a day (QID) | ORAL | Status: DC | PRN

## 2014-08-17 MED ORDER — IBUPROFEN 600 MG PO TABS: 600.0000 mg | ORAL_TABLET | Freq: Four times a day (QID) | ORAL | Status: DC | PRN

## 2014-08-17 MED ORDER — LACTATED RINGERS IV SOLN: INTRAVENOUS | Status: DC

## 2014-08-17 MED ORDER — PROPOFOL 10 MG/ML IV EMUL
INTRAVENOUS | Status: AC
  Filled 2014-08-17: qty 20

## 2014-08-17 MED ORDER — KETOROLAC TROMETHAMINE 30 MG/ML IJ SOLN
INTRAMUSCULAR | Status: DC | PRN
  Administered 2014-08-17: 30 mg via INTRAVENOUS

## 2014-08-17 MED ORDER — FENTANYL CITRATE 0.05 MG/ML IJ SOLN
INTRAMUSCULAR | Status: DC | PRN
  Administered 2014-08-17 (×2): 50 ug via INTRAVENOUS

## 2014-08-17 MED ORDER — BUPIVACAINE HCL (PF) 0.5 % IJ SOLN
INTRAMUSCULAR | Status: AC
  Filled 2014-08-17: qty 30

## 2014-08-17 MED ORDER — DEXAMETHASONE SODIUM PHOSPHATE 4 MG/ML IJ SOLN
INTRAMUSCULAR | Status: AC
  Filled 2014-08-17: qty 1

## 2014-08-17 MED ORDER — ONDANSETRON HCL 4 MG/2ML IJ SOLN
INTRAMUSCULAR | Status: DC | PRN
  Administered 2014-08-17: 4 mg via INTRAVENOUS

## 2014-08-17 MED ORDER — VASOPRESSIN 20 UNIT/ML IJ SOLN
INTRAMUSCULAR | Status: AC
  Filled 2014-08-17: qty 1

## 2014-08-17 MED ORDER — LACTATED RINGERS IV SOLN
INTRAVENOUS | Status: DC
  Administered 2014-08-17: 14:00:00 via INTRAVENOUS

## 2014-08-17 MED ORDER — LIDOCAINE HCL (CARDIAC) 20 MG/ML IV SOLN
INTRAVENOUS | Status: DC | PRN
  Administered 2014-08-17: 40 mg via INTRAVENOUS

## 2014-08-17 MED ORDER — SODIUM CHLORIDE 0.9 % IJ SOLN
INTRAMUSCULAR | Status: AC
  Filled 2014-08-17: qty 50

## 2014-08-17 MED ORDER — SCOPOLAMINE 1 MG/3DAYS TD PT72
1.0000 | MEDICATED_PATCH | Freq: Once | TRANSDERMAL | Status: DC
  Administered 2014-08-17: 1.5 mg via TRANSDERMAL

## 2014-08-17 MED ORDER — DEXAMETHASONE SODIUM PHOSPHATE 10 MG/ML IJ SOLN
INTRAMUSCULAR | Status: DC | PRN
  Administered 2014-08-17: 4 mg via INTRAVENOUS

## 2014-08-17 MED ORDER — BUPIVACAINE-EPINEPHRINE (PF) 0.5% -1:200000 IJ SOLN
INTRAMUSCULAR | Status: DC | PRN
  Administered 2014-08-17 (×2): 10 mL

## 2014-08-17 MED ORDER — MIDAZOLAM HCL 2 MG/2ML IJ SOLN
INTRAMUSCULAR | Status: DC | PRN
  Administered 2014-08-17: 1 mg via INTRAVENOUS

## 2014-08-17 MED ORDER — BUPIVACAINE-EPINEPHRINE (PF) 0.5% -1:200000 IJ SOLN
INTRAMUSCULAR | Status: AC
  Filled 2014-08-17: qty 30

## 2014-08-17 MED ORDER — PROPOFOL 10 MG/ML IV BOLUS
INTRAVENOUS | Status: DC | PRN
  Administered 2014-08-17: 50 mg via INTRAVENOUS
  Administered 2014-08-17: 20 mg via INTRAVENOUS
  Administered 2014-08-17 (×2): 50 mg via INTRAVENOUS
  Administered 2014-08-17: 20 mg via INTRAVENOUS
  Administered 2014-08-17: 50 mg via INTRAVENOUS
  Administered 2014-08-17: 20 mg via INTRAVENOUS

## 2014-08-17 MED ORDER — LIDOCAINE HCL (CARDIAC) 20 MG/ML IV SOLN
INTRAVENOUS | Status: AC
  Filled 2014-08-17: qty 5

## 2014-08-17 MED ORDER — FENTANYL CITRATE 0.05 MG/ML IJ SOLN
INTRAMUSCULAR | Status: AC
  Filled 2014-08-17: qty 5

## 2014-08-17 MED ORDER — MIDAZOLAM HCL 2 MG/2ML IJ SOLN
INTRAMUSCULAR | Status: AC
  Filled 2014-08-17: qty 2

## 2014-08-17 MED ORDER — ONDANSETRON HCL 4 MG/2ML IJ SOLN
INTRAMUSCULAR | Status: AC
  Filled 2014-08-17: qty 2

## 2014-08-17 MED ORDER — SCOPOLAMINE 1 MG/3DAYS TD PT72
MEDICATED_PATCH | TRANSDERMAL | Status: AC
  Administered 2014-08-17: 1.5 mg via TRANSDERMAL
  Filled 2014-08-17: qty 1

## 2014-08-17 SURGICAL SUPPLY — 27 items
CANISTER SUCT 3000ML (MISCELLANEOUS) ×4 IMPLANT
CLOTH BEACON ORANGE TIMEOUT ST (SAFETY) ×4 IMPLANT
CONT PATH 16OZ SNAP LID 3702 (MISCELLANEOUS) IMPLANT
COVER MAYO STAND STRL (DRAPES) IMPLANT
DECANTER SPIKE VIAL GLASS SM (MISCELLANEOUS) ×4 IMPLANT
DRAPE HYSTEROSCOPY (DRAPE) ×4 IMPLANT
DRSG TELFA 3X8 NADH (GAUZE/BANDAGES/DRESSINGS) IMPLANT
GAUZE PACKING 2X5 YD STRL (GAUZE/BANDAGES/DRESSINGS) ×4 IMPLANT
GLOVE BIO SURGEON STRL SZ7 (GLOVE) ×4 IMPLANT
GLOVE BIOGEL PI IND STRL 6.5 (GLOVE) ×2 IMPLANT
GLOVE BIOGEL PI IND STRL 7.0 (GLOVE) ×4 IMPLANT
GLOVE BIOGEL PI INDICATOR 6.5 (GLOVE) ×2
GLOVE BIOGEL PI INDICATOR 7.0 (GLOVE) ×4
GOWN STRL REUS W/TWL LRG LVL3 (GOWN DISPOSABLE) ×12 IMPLANT
HEMOSTAT SURGICEL 2X3 (HEMOSTASIS) ×4 IMPLANT
NEEDLE SPNL 20GX3.5 QUINCKE YW (NEEDLE) ×4 IMPLANT
NS IRRIG 1000ML POUR BTL (IV SOLUTION) ×4 IMPLANT
PACK VAGINAL WOMENS (CUSTOM PROCEDURE TRAY) ×4 IMPLANT
PAD OB MATERNITY 4.3X12.25 (PERSONAL CARE ITEMS) ×4 IMPLANT
SUT VIC AB 0 CT1 18XCR BRD8 (SUTURE) ×2 IMPLANT
SUT VIC AB 0 CT1 36 (SUTURE) IMPLANT
SUT VIC AB 0 CT1 8-18 (SUTURE) ×2
SUT VICRYL 0 TIES 12 18 (SUTURE) ×4 IMPLANT
SYR 30ML LL (SYRINGE) ×4 IMPLANT
TOWEL OR 17X24 6PK STRL BLUE (TOWEL DISPOSABLE) ×8 IMPLANT
TRAY FOLEY CATH 14FR (SET/KITS/TRAYS/PACK) ×4 IMPLANT
WATER STERILE IRR 1000ML POUR (IV SOLUTION) IMPLANT

## 2014-08-17 NOTE — Transfer of Care (Signed)
Immediate Anesthesia Transfer of Care Note  Patient: Linda Duncan  Procedure(s) Performed: Procedure(s): RESECTION OF SUB- URETHRAL CYST (N/A)  Patient Location: PACU  Anesthesia Type:MAC  Level of Consciousness: awake, alert  and oriented  Airway & Oxygen Therapy: Patient Spontanous Breathing  Post-op Assessment: Report given to PACU RN and Post -op Vital signs reviewed and stable  Post vital signs: Reviewed and stable  Complications: No apparent anesthesia complications

## 2014-08-17 NOTE — Op Note (Signed)
08/17/2014  5:27 PM  PATIENT:  Linda Duncan  28 y.o. female  PRE-OPERATIVE DIAGNOSIS:  Resection of suburethral cyst  POST-OPERATIVE DIAGNOSIS:  Resection of suburethral cyst  PROCEDURE:  Procedure(s): RESECTION OF SUB- URETHRAL CYST (N/A)  SURGEON:  Surgeon(s) and Role:    * Willodean Rosenthal, MD - Primary  ANESTHESIA:   local and IV sedation  EBL:  Total I/O In: 1000 [I.V.:1000] Out: 330 [Urine:300; Blood:30]  BLOOD ADMINISTERED:none  DRAINS: none   LOCAL MEDICATIONS USED:  MARCAINE     SPECIMEN:  Source of Specimen:  suburethral cyst  DISPOSITION OF SPECIMEN:  PATHOLOGY  COUNTS:  YES  TOURNIQUET:  * No tourniquets in log *  DICTATION: .Note written in EPIC  PLAN OF CARE: Discharge to home after PACU  PATIENT DISPOSITION:  PACU - hemodynamically stable.   Delay start of Pharmacological VTE agent (>24hrs) due to surgical blood loss or risk of bleeding: not applicable  Patient counseled regarding the risk, benefits and alternatives to this procedures.  This was done with the use of a Spanish speaking interpreter. The risks of surgery were discussed in detail with the patient including but not limited to: bleeding which may require transfusion or reoperation; infection which may require prolonged hospitalization or re-hospitalization and antibiotic therapy; injury to bowel, bladder, ureters and major vessels or other surrounding organs; need for additional procedures; urinary retention which may necessitate indwelling foley catheter for several days; thromboembolic phenomenon, incisional problems and other postoperative or anesthesia complications.  All questions were answered.   The patient was taken to the operating room where spinal anesthesia was placed.  She was placed in the dorsal lithotomy position and prepped and draped in the usual sterile fashion.  A foley catheter was placed in the patients bladder and than the two Allis clamps were placed at the  lateral edge of the 3cm anterior vaginal wall cyst. 10cc of 0.5% Marcaine with epinephrine was injected cyst was elevated, hydro dissection of the vaginal mucosa was performed.  Using a scalpel an elliptical incision was made around the base of the cyst.  The vaginal mucosa was dissected off of the cyst and the cyst was removed in its entirety.   A small piece of Surgicele was placed inside the defect. The vaginal mucosa was reapproximated using 3-0 vicryl in a running locked fashion. Excellent hemostasis was noted.  The vagina was packed with a vaginal packing which will be removed prior to discharge to home.  Sponge, lap and needle counts were correct times 2 and there was no known complicated noted.  The patient tolerated the procedure well and was take to recovery room in awake and in stable condition.    She will be discharged to home on Motrin and Percocet.  She will be on pelvic rest for 4 weeks. Her f/u appt was made for 2 weeks.

## 2014-08-17 NOTE — Anesthesia Preprocedure Evaluation (Deleted)
Anesthesia Evaluation  Patient identified by MRN, date of birth, ID band Patient awake    Reviewed: Allergy & Precautions, H&P , Patient's Chart, lab work & pertinent test results, reviewed documented beta blocker date and time   History of Anesthesia Complications Negative for: history of anesthetic complications  Airway Mallampati: II TM Distance: >3 FB Neck ROM: full    Dental   Pulmonary  breath sounds clear to auscultation        Cardiovascular Exercise Tolerance: Good Rhythm:regular Rate:Normal     Neuro/Psych    GI/Hepatic   Endo/Other    Renal/GU      Musculoskeletal   Abdominal   Peds  Hematology   Anesthesia Other Findings   Reproductive/Obstetrics                           Anesthesia Physical Anesthesia Plan  ASA: I  Anesthesia Plan: General LMA   Post-op Pain Management:    Induction:   Airway Management Planned:   Additional Equipment:   Intra-op Plan:   Post-operative Plan:   Informed Consent: I have reviewed the patients History and Physical, chart, labs and discussed the procedure including the risks, benefits and alternatives for the proposed anesthesia with the patient or authorized representative who has indicated his/her understanding and acceptance.   Dental Advisory Given  Plan Discussed with: CRNA and Surgeon  Anesthesia Plan Comments:         Anesthesia Quick Evaluation

## 2014-08-17 NOTE — H&P (Signed)
  Patient ID: Linda Duncan, female DOB: 04-08-1986, 28 y.o. MRN: [id number]  HPI Pt c/o painful cyst in vagina. She reports that it makes her feel that she has to void at all times. She was initially told that it could be drained in the ofc but now 2 physician have said that it is not possible.   Past Medical History  Diagnosis Date  . Medical history non-contributory    Past Surgical History  Procedure Laterality Date  . Cholecystectomy     No current facility-administered medications on file prior to encounter.   No current outpatient prescriptions on file prior to encounter.  No Known Allergies History   Social History  . Marital Status: Single    Spouse Name: N/A    Number of Children: N/A  . Years of Education: N/A   Occupational History  . Not on file.   Social History Main Topics  . Smoking status: Never Smoker   . Smokeless tobacco: Never Used  . Alcohol Use: No  . Drug Use: No  . Sexual Activity: Yes    Birth Control/ Protection: Implant   Other Topics Concern  . Not on file   Social History Narrative  . No narrative on file   History reviewed. No pertinent family history.    Review of Systems  Objective:   Physical Exam.vs Pt in NAD  GU:  EGBUS: no lesions  Vagina: inclusion cyst just under the urethra. ~2-3 cm  Cervix: no lesion; no mucopurulent d/c   CBC    Component Value Date/Time   WBC 6.4 07/29/2014 1400   RBC 4.71 07/29/2014 1400   HGB 14.1 07/29/2014 1400   HCT 40.6 07/29/2014 1400   PLT 223 07/29/2014 1400   MCV 86.2 07/29/2014 1400   MCH 29.9 07/29/2014 1400   MCHC 34.7 07/29/2014 1400   RDW 12.9 07/29/2014 1400   LYMPHSABS 2.5 03/08/2010 0121   MONOABS 0.3 03/08/2010 0121   EOSABS 0.1 03/08/2010 0121   BASOSABS 0.0 03/08/2010 0121     Assessment:   Suburethral inclusion cyst- discussed with pt that a simple drainage could be done in the ofc but would simply return. Rec resection  Plan:   Patient desires surgical management with  resection of suburethral inclusion cyst. The risks of surgery were discussed in detail with the patient including but not limited to: bleeding which may require transfusion or reoperation; infection which may require prolonged hospitalization or re-hospitalization and antibiotic therapy; injury to bowel, bladder, urethra and major vessels or other surrounding organs; need for additional procedures including laparotomy; thromboembolic phenomenon, incisional problems and other postoperative or anesthesia complications. Patient was told that the likelihood that her condition and symptoms will be treated effectively with this surgical management was very high; the postoperative expectations were also discussed in detail. The patient also understands the alternative treatment options which were discussed in full. All questions were answered.

## 2014-08-17 NOTE — Anesthesia Preprocedure Evaluation (Addendum)
Anesthesia Evaluation  Patient identified by MRN, date of birth, ID band Patient awake    Reviewed: Allergy & Precautions, H&P , Patient's Chart, lab work & pertinent test results, reviewed documented beta blocker date and time   History of Anesthesia Complications Negative for: history of anesthetic complications  Airway Mallampati: II TM Distance: >3 FB Neck ROM: full    Dental   Pulmonary  breath sounds clear to auscultation        Cardiovascular Exercise Tolerance: Good Rhythm:regular Rate:Normal     Neuro/Psych negative psych ROS   GI/Hepatic   Endo/Other    Renal/GU      Musculoskeletal   Abdominal   Peds  Hematology   Anesthesia Other Findings   Reproductive/Obstetrics                           Anesthesia Physical Anesthesia Plan  ASA: I  Anesthesia Plan: MAC   Post-op Pain Management:    Induction:   Airway Management Planned:   Additional Equipment:   Intra-op Plan:   Post-operative Plan:   Informed Consent: I have reviewed the patients History and Physical, chart, labs and discussed the procedure including the risks, benefits and alternatives for the proposed anesthesia with the patient or authorized representative who has indicated his/her understanding and acceptance.   Dental Advisory Given  Plan Discussed with: CRNA, Surgeon and Anesthesiologist  Anesthesia Plan Comments:         Anesthesia Quick Evaluation  

## 2014-08-17 NOTE — Brief Op Note (Signed)
08/17/2014  5:27 PM  PATIENT:  Linda Duncan  28 y.o. female  PRE-OPERATIVE DIAGNOSIS:  Resection of suburethral cyst  POST-OPERATIVE DIAGNOSIS:  Resection of suburethral cyst  PROCEDURE:  Procedure(s): RESECTION OF SUB- URETHRAL CYST (N/A)  SURGEON:  Surgeon(s) and Role:    * Willodean Rosenthal, MD - Primary  ANESTHESIA:   local and IV sedation  EBL:  Total I/O In: 1000 [I.V.:1000] Out: 330 [Urine:300; Blood:30]  BLOOD ADMINISTERED:none  DRAINS: none   LOCAL MEDICATIONS USED:  MARCAINE     SPECIMEN:  Source of Specimen:  suburethral cyst  DISPOSITION OF SPECIMEN:  PATHOLOGY  COUNTS:  YES  TOURNIQUET:  * No tourniquets in log *  DICTATION: .Note written in EPIC  PLAN OF CARE: Discharge to home after PACU  PATIENT DISPOSITION:  PACU - hemodynamically stable.   Delay start of Pharmacological VTE agent (>24hrs) due to surgical blood loss or risk of bleeding: not applicable

## 2014-08-17 NOTE — Anesthesia Postprocedure Evaluation (Signed)
  Anesthesia Post Note  Patient: Linda Duncan  Procedure(s) Performed: Procedure(s) (LRB): RESECTION OF SUB- URETHRAL CYST (N/A)  Anesthesia type: MAC  Patient location: PACU  Post pain: Pain level controlled  Post assessment: Post-op Vital signs reviewed  Last Vitals:  Filed Vitals:   08/17/14 1325  BP: 105/62  Pulse: 77  Temp: 36.8 C  Resp: 18    Post vital signs: Reviewed  Level of consciousness: sedated  Complications: No apparent anesthesia complications

## 2014-08-17 NOTE — Discharge Instructions (Signed)
°  Dilatación y curetaje o curetaje por aspiración, cuidados posteriores °(Dilation and Curettage or Vacuum Curettage, Care After) °Estas indicaciones le proporcionan información acerca de cómo deberá cuidarse después del procedimiento. El médico también podrá darle instrucciones específicas. Comuníquese con el médico si tiene algún problema o tiene preguntas después del procedimiento. °CUIDADOS EN EL HOGAR °· No conduzca durante 24 horas. °· Espere 1 semana antes de realizar actividades que la cansen mucho. °· Tómese la temperatura dos (2) veces al día, durante 4 días. Anótelas. Comuníquese con su médico si tiene fiebre. °· No permanezca de pie durante mucho tiempo. °· No levante, empuje ni jale objetos de más de 10 libras (4,5 kilogramos). °· Solo suba escaleras una o dos veces por día. °· Descanse con frecuencia. °· Siga con su dieta habitual. °· Beba suficiente líquido para mantener el pis (orina) claro o de color amarillo pálido. °· Si tiene dificultad para mover el intestino (estreñimiento), puede hacer lo siguiente: °¨ Tome algún medicamento que la ayude a mover el intestino (laxante) según las indicaciones de su médico. °¨ Consuma más fruta y salvado. °¨ Beba más líquidos. °· Tome una ducha, no un baño de inmersión, durante el tiempo que le indique su médico. °· No practique natación ni use el jacuzzi hasta que el médico la autorice. °· Pídale a alguien que se quede con usted durante 1 o 2 días después del procedimiento. °· No se haga duchas vaginales, no use tampones ni tenga sexo (relaciones sexuales) durante 2 semanas. °· Solo tome los medicamentos que le haya indicado su médico. No tome aspirina. Puede ocasionar hemorragias. °· Cumpla con los controles médicos. °SOLICITE AYUDA SI: °· Tiene cólicos o siente un dolor que no puede controlar con los medicamentos. °· Siente dolor en el vientre (abdomen). °· Advierte un olor fétido que proviene de la vagina. °· Tiene una erupción cutánea. °· Tiene problemas con  los medicamentos. °SOLICITE AYUDA DE INMEDIATO SI:  °· Tiene una hemorragia más abundante que un período normal. °· Tiene fiebre. °· Siente dolor en el pecho. °· Tiene dificultad para respirar. °· Se siente mareada o siente como si fuera a desmayarse (vahído). °· Se desmaya. °· Siente dolor en la zona superior de los hombros. °· Tiene una hemorragia vaginal, con o sin grumos de sangre (coágulos sanguíneos). °ASEGÚRESE DE QUE: °· Comprende estas instrucciones. °· Controlará su afección. °· Recibirá ayuda de inmediato si no mejora o si empeora. °Document Released: 08/01/2011 Document Revised: 12/08/2013 °ExitCare® Patient Information ©2015 [employer]. This information is not intended to replace advice given to you by your health care provider. Make sure you discuss any questions you have with your health care provider. ° ° °

## 2014-08-18 ENCOUNTER — Encounter (HOSPITAL_COMMUNITY): Payer: Self-pay | Admitting: Obstetrics & Gynecology

## 2014-09-08 ENCOUNTER — Encounter: Payer: Self-pay | Admitting: Obstetrics & Gynecology

## 2014-09-08 ENCOUNTER — Ambulatory Visit (INDEPENDENT_AMBULATORY_CARE_PROVIDER_SITE_OTHER): Payer: Self-pay | Admitting: Obstetrics & Gynecology

## 2014-09-08 VITALS — BP 113/71 | HR 72 | Temp 98.2°F | Ht 60.0 in | Wt 135.6 lb

## 2014-09-08 DIAGNOSIS — Z9889 Other specified postprocedural states: Secondary | ICD-10-CM

## 2014-09-08 NOTE — Progress Notes (Signed)
Subjective:     Patient ID: Linda Duncan, female   DOB: 1986-10-13, 28 y.o.   MRN: [id number]  HPI Pt presents to for 4 week post op check. She reports that she had pain for 2 days and has had no further bleeding or pain.  She denies problems with voiding.   Review of Systems     Objective:   Physical Exam BP 113/71  Pulse 72  Temp(Src) 98.2 F (36.8 C) (Oral)  Ht 5' (1.524 m)  Wt 135 lb 9.6 oz (61.508 kg)  BMI 26.48 kg/m2 Pt in NAD GU: EGBUS: no lesions Vagina: no blood in vault; sutures noted.  No erythema or abnormal discharge noted     Assessment:     4 weeks post op check- doing well     Plan:     F/u in  2 weeks pr sooner prn Suture removal in 2 weeks if they have not dissolved.  No intercourse for another 2 weeks

## 2014-09-08 NOTE — Patient Instructions (Signed)
Desgarro vaginal  (Vaginal Laceration)  La laceracin vaginal es un desgarro de la pared de la vagina. Se divide en tres categoras:  1. La que se producen por situaciones obsttricas, se producen en el momento del parto. 2. Las que se relacionan con traumatismos (generalmente durante las relaciones sexuales). 3. Laceracin espontnea. Pueden causar sangrado (hemorragia) segn la gravedad del Insurance account manager. Pueden causar intenso dolor e interferir con las actividades normales de la vida. Pueden hacer que las relaciones sexuales sean dolorosas y provocar ardor al Geographical information systems officer. Si ha sufrido Hotel manager vaginal, el rea alrededor de la vagina puede doler al tocarse o higienizarse. Incluso una ligera presin de la ropa puede Programmer, multimedia. Es necesario que el mdico la evale.   CAUSAS   Causas obsttricas, como el [address].  Traumatismo que puede ser el resultado de un accidente durante una actividad, como durante las relaciones sexuales o un paseo en bicicleta.  Causas espontneas relacionadas con el envejecimiento, la falta de curacin de un desgarro obsttrico anterior, irritacin crnica, o modificaciones en la piel de causa no bien determinadas. SNTOMAS   Sangrado vaginal leve o abundante.  Hinchazn vaginal.  Dolor leve a intenso.  Sensibilidad vaginal. DIAGNSTICO  Si la laceracin sucedi durante el parto, el mdico puede diagnosticarlo en ese momento. Para hacer el diagnstico en caso de un desgarro vaginal espontneo o a causa de un traumatismo, el mdico realizar un examen fsico. Durante el examen fsico, el mdico buscar cualquier signo de alteraciones que podran requerir ms estudios. Si hay hemorragia, indicar anlisis de sangre para determinar la extensin de la hemorragia. Podr indicarle un diagnstico por imgenes, como ecografas o una tomografa computarizada (CT), para descartar daos en el interior. Ser necesario realizar una biopsia si hay signos de un problema ms grave.    TRATAMIENTO  El tratamiento depende de la gravedad de Engineer, production. Las heridas pequeas se curan sin tratamiento y slo es necesario mantener el rea limpia y [place]. En algunos casos es necesario cerrar con puntos de sutura. En otros casos se curan por s mismos con la ayuda de distintos recursos, como ungentos antibiticos, cremas con medicamento o productos con vaselina. Segn las circunstancias, podrn recetarle las hormonas por va oral. Estos medicamos hormonales tambin pueden aplicarse en forma de cremas tpicas y tabletas vaginales. En situaciones ms graves, puede ser necesaria la hospitalizacin y la reparacin quirrgica del Insurance account manager.  INSTRUCCIONES PARA EL CUIDADO EN EL HOGAR   Tome baos de agua caliente cubriendo las caderas y las nalgas (bao de asiento) 2 a 3 veces por Futures trader. Esto podr [employer] y la hinchazn.   Slo tomar medicamentos de venta libre o recetados para Primary school teacher, Environmental health practitioner o la fiebre como lo indique su mdico. No use aspirina porque puede causar aumento del sangrado.   No utilice tampones, duchas vaginales ni tenga relaciones sexuales hasta que el mdico la autorice.   Podrn aplicarle un vendaje (apsito). Cambie el apsito una vez al da o segn las indicaciones. Si el apsito se pega, remjelo con agua tibia jabonosa.   Aplique hielo o almohadillas de hamamelis en la vagina para disminuir cualquier Brewing technologist.   Tome un laxante o siga una dieta especial como lo indique su mdico. Esto ayudar a Acupuncturist asociado con las deposiciones.  SOLICITE ATENCIN MDICA DE INMEDIATO SI:   Usted tiene enrojecimiento o hinchazn en el rea vaginal.   Siente dolor que [place], es agudo o intenso o tiene sensibilidad en la  zona vaginal.  Observa pus o una secrecin inusual en la laceracin o la vagina.   Siente mal olor que proviene de la vagina.   La laceracin vuelve a abrirse despus de curarse o haberse reparado.    Se siente mareada.  Siente cada vez ms dolor abdominal.   Sufre una hemorragia vaginal anormal, o sta aumenta.   Siente dolor durante las relaciones sexuales despus de que el desgarro se ha curado.  ASEGRESE DE QUE:   Comprende estas instrucciones.  Controlar su enfermedad.  Solicitar ayuda de inmediato si no mejora o si empeora. Document Released: 09/19/2006 Document Revised: 08/27/2012 [facility] Patient Information 2015 [place], [place]. This information is not intended to replace advice given to you by your health care provider. Make sure you discuss any questions you have with your health care provider.

## 2014-09-27 ENCOUNTER — Encounter: Payer: Self-pay | Admitting: Obstetrics & Gynecology

## 2014-09-27 ENCOUNTER — Ambulatory Visit (INDEPENDENT_AMBULATORY_CARE_PROVIDER_SITE_OTHER): Payer: Self-pay | Admitting: Obstetrics & Gynecology

## 2014-09-27 VITALS — BP 112/69 | HR 81 | Ht 60.0 in | Wt 135.0 lb

## 2014-09-27 DIAGNOSIS — Z9889 Other specified postprocedural states: Secondary | ICD-10-CM

## 2014-09-27 NOTE — Progress Notes (Signed)
Subjective:     Patient ID: Linda DikesAnalberta Castro-Nava, female   DOB: 1986-05-17, 28 y.o.   MRN: [id number]  HPI Pt presents for post op check. She denies problems currently.  She reports that she is on her menses currently.  She denies pain.  Review of Systems     Objective:   Physical Exam BP 112/69  Pulse 81  Ht 5' (1.524 m)  Wt 135 lb (61.236 kg)  BMI 26.37 kg/m2 Pt in NAD GU: EGBUS: no lesions Vagina: + blood in vault; vaginal mucosa well healed     Assessment:     6 week post op check     Plan:     Return to full activity F/u 3 months or sooner prn

## 2014-09-27 NOTE — Patient Instructions (Signed)

## 2014-10-18 ENCOUNTER — Encounter: Payer: Self-pay | Admitting: Obstetrics & Gynecology

## 2015-10-20 ENCOUNTER — Ambulatory Visit: Payer: Self-pay | Attending: Internal Medicine

## 2016-02-24 ENCOUNTER — Ambulatory Visit: Payer: Self-pay | Attending: Family Medicine | Admitting: Family Medicine

## 2016-02-24 ENCOUNTER — Encounter: Payer: Self-pay | Admitting: Family Medicine

## 2016-02-24 VITALS — BP 102/66 | HR 79 | Temp 98.6°F | Resp 16 | Ht 60.0 in | Wt 124.0 lb

## 2016-02-24 DIAGNOSIS — Z9049 Acquired absence of other specified parts of digestive tract: Secondary | ICD-10-CM | POA: Insufficient documentation

## 2016-02-24 DIAGNOSIS — Z0000 Encounter for general adult medical examination without abnormal findings: Secondary | ICD-10-CM

## 2016-02-24 DIAGNOSIS — Z9889 Other specified postprocedural states: Secondary | ICD-10-CM | POA: Insufficient documentation

## 2016-02-24 DIAGNOSIS — I83811 Varicose veins of right lower extremities with pain: Secondary | ICD-10-CM | POA: Insufficient documentation

## 2016-02-24 DIAGNOSIS — Z23 Encounter for immunization: Secondary | ICD-10-CM

## 2016-02-24 DIAGNOSIS — I83891 Varicose veins of right lower extremities with other complications: Secondary | ICD-10-CM | POA: Insufficient documentation

## 2016-02-24 DIAGNOSIS — I8391 Asymptomatic varicose veins of right lower extremity: Secondary | ICD-10-CM

## 2016-02-24 LAB — POCT GLYCOSYLATED HEMOGLOBIN (HGB A1C): HEMOGLOBIN A1C: 5.1

## 2016-02-24 MED ORDER — MEDICAL COMPRESSION STOCKINGS MISC: Status: DC

## 2016-02-24 NOTE — Progress Notes (Signed)
Pt requesting referral. Pain on Rt leg  Pain scale #6 No tobacco  No suicidal thoughts in the past two weeks

## 2016-02-24 NOTE — Progress Notes (Signed)
LOGO@  Subjective:  Patient ID: Linda Duncan, female    DOB: 1986/05/29  Age: 30 y.o. MRN: [id number]  Spanish interpreter used   CC: Referral and Leg Pain   HPI Fatimata Duncan presents for   1. Varicose veins: R leg x 11 years. Started when she was pregnant with first child. Worsening. Associated with pain. No bleeding or bruising. She has not tried compression.   Past Medical History  Diagnosis Date  . Medical history non-contributory     Past Surgical History  Procedure Laterality Date  . Cholecystectomy    . Urethral cyst removal N/A 08/17/2014    Procedure: RESECTION OF SUB- URETHRAL CYST;  Surgeon: Willodean Rosenthalarolyn Harraway-Smith, MD;  Location: [facility];  Service: Gynecology;  Laterality: N/A;    No family history on file.  Social History  Substance Use Topics  . Smoking status: Never Smoker   . Smokeless tobacco: Never Used  . Alcohol Use: No    ROS Review of Systems  Constitutional: Negative for fever and chills.  Eyes: Negative for visual disturbance.  Respiratory: Negative for shortness of breath.   Cardiovascular: Negative for chest pain.  Gastrointestinal: Negative for abdominal pain and blood in stool.  Musculoskeletal: Positive for myalgias. Negative for back pain and arthralgias.  Skin: Negative for rash.  Allergic/Immunologic: Negative for immunocompromised state.  Hematological: Negative for adenopathy. Does not bruise/bleed easily.  Psychiatric/Behavioral: Negative for suicidal ideas and dysphoric mood.    Objective:   Today's Vitals: BP 102/66 mmHg  Pulse 79  Temp(Src) 98.6 F (37 C) (Oral)  Resp 16  Ht 5' (1.524 m)  Wt 124 lb (56.246 kg)  BMI 24.22 kg/m2  SpO2 100%  LMP 02/01/2016  Physical Exam  Constitutional: She is oriented to person, place, and time. She appears well-developed and well-nourished. No distress.  HENT:  Head: Normocephalic and atraumatic.  Cardiovascular: Normal rate, regular rhythm, normal heart sounds and  intact distal pulses.   Pulmonary/Chest: Effort normal and breath sounds normal.  Musculoskeletal: She exhibits no edema.       Legs: Neurological: She is alert and oriented to person, place, and time.  Skin: Skin is warm and dry. No rash noted.  Psychiatric: She has a normal mood and affect.   Lab Results  Component Value Date   HGBA1C 5.1 02/24/2016     Assessment & Plan:   Problem List Items Addressed This Visit    None      Outpatient Encounter Prescriptions as of 02/24/2016  Medication Sig  . acetaminophen (TYLENOL) 325 MG tablet Take 325 mg by mouth every 6 (six) hours as needed for headache.  . ibuprofen (ADVIL,MOTRIN) 600 MG tablet Take 1 tablet (600 mg total) by mouth every 6 (six) hours as needed.  Marland Kitchen. oxyCODONE-acetaminophen (PERCOCET/ROXICET) 5-325 MG per tablet Take 1-2 tablets by mouth every 6 (six) hours as needed.   No facility-administered encounter medications on file as of 02/24/2016.    Follow-up: No Follow-up on file.    Dessa PhiJosalyn Catrice Zuleta MD

## 2016-02-24 NOTE — Assessment & Plan Note (Signed)
A: 10 year of varicose veins, R leg, worsening, painful  P: Compression stockings Vascular referral

## 2016-02-24 NOTE — Patient Instructions (Signed)
Linda Duncan was seen today for referral and leg pain.  Diagnoses and all orders for this visit:  Healthcare maintenance -     HgB A1c  Varicose veins of right lower extremity -     Ambulatory referral to Vascular Surgery  Other orders -     Tdap vaccine greater than or equal to 31yo IM   F/u in 3 months for varicose veins  Dr. Erick BlinksFunches   Venas varicosas (Varicose Veins) Las venas varicosas son venas que se han agrandado y tornado sinuosas. Suelen aparecer Cox Communicationsen las piernas, pero tambin pueden verse en otra parte del cuerpo. CAUSAS Esta afeccin se presenta como consecuencia del mal funcionamiento de las vlvulas de las venas, las cuales ayudan al retorno de la sangre desde las piernas hacia el corazn. Si estas vlvulas se daan, la sangre retrocede y regresa a las venas de la pierna, cerca de la superficie de la piel, lo que causa dilatacin venosa. FACTORES DE RIESGO Las personas que estn mucho tiempo paradas, las embarazadas o las personas con sobrepeso tienen ms probabilidades de tener venas varicosas. SIGNOS Y SNTOMAS  Venas abultadas, azuladas y de aspecto sinuoso que se observan con mayor frecuencia en las piernas.  Dolor o sensacin de Development worker, communitypesadez en las piernas. Estos sntomas pueden empeorar al final del da.  Hinchazn de las piernas.  Cambios en el color de la piel. DIAGNSTICO Generalmente, el mdico puede diagnosticar las venas varicosas al examinarle las piernas. Adems, puede recomendarle que se haga una ecografa de las venas de las piernas. TRATAMIENTO La mayora de las venas varicosas pueden tratarse en casa. Sin embargo, hay otros tratamientos a disposicin de McGraw-Hilllas personas que tienen sntomas persistentes o desean mejorar la apariencia esttica de las venas varicosas. Estas opciones de tratamiento incluyen lo siguiente:  Escleroterapia. Se inyecta una solucin en la vena para anularla.  Tratamiento con lser. Se [place] un rayo lser para calentar la vena y  anularla.  Ablacin venosa por radiofrecuencia Se B[place]ausa una corriente elctrica que se produce mediante ondas de radio para anular la vena.  Flebectoma. Se extirpa quirrgicamente la vena a travs de pequeas incisiones que se hacen sobre la vena varicosa.  Ligadura venosa y varicectoma. La vena se extirpa quirrgicamente a travs de incisiones que se realizan sobre la vena varicosa despus de haberla anudado (ligado). INSTRUCCIONES PARA EL CUIDADO EN EL HOGAR  No permanezca sentado o de pie en una posicin durante mucho tiempo. No se siente con las piernas cruzadas. Descanse con las piernas Radiation protection practitionerelevadas durante el da.  Use medias de compresin como le haya indicado su mdico. Estas medias ayudan a evitar la formacin de cogulos sanguneos y a Building services engineerreducir la hinchazn de las piernas.  No use otras prendas que le ajusten todo el contorno de las piernas, la pelvis o la cintura.  Camine todo lo posible para aumentar la circulacin de Risk managerla sangre.  A la noche, eleve el pie de la cama con bloques de 2pulgadas.  Si tiene un corte en la piel sobre la vena y la vena sangra, recustese con la pierna elevada y Colombiaejerza presin en el lugar con un pao limpio, hasta que deje de Geophysicist/field seismologistsangrar. Luego aplique un apsito (vendaje) sobre el corte. Consulte al mdico si el sangrado contina. SOLICITE ATENCIN MDICA SI:  La piel alrededor del tobillo empieza a Lobbyistagrietarse.  Siente dolor, hay enrojecimiento, sensibilidad o hinchazn dura en la pierna sobre una vena.  Est incmodo debido al dolor de la pierna.   Esta informacin no  tiene Theme park manager el consejo del mdico. Asegrese de hacerle al mdico cualquier pregunta que tenga.   Document Released: 09/12/2005 Document Revised: 12/24/2014 Elsevier Interactive Patient Education [employer]

## 2016-04-03 ENCOUNTER — Other Ambulatory Visit: Payer: Self-pay | Admitting: *Deleted

## 2016-04-03 DIAGNOSIS — I83811 Varicose veins of right lower extremities with pain: Secondary | ICD-10-CM

## 2016-05-18 ENCOUNTER — Encounter: Payer: Self-pay | Admitting: Vascular Surgery

## 2016-05-25 ENCOUNTER — Encounter: Payer: Self-pay | Admitting: Vascular Surgery

## 2016-05-25 ENCOUNTER — Ambulatory Visit (HOSPITAL_COMMUNITY)
Admission: RE | Admit: 2016-05-25 | Discharge: 2016-05-25 | Disposition: A | Discharge status: Home / Self Care | Payer: No Typology Code available for payment source | Source: Ambulatory Visit | Attending: Vascular Surgery | Admitting: Vascular Surgery

## 2016-05-25 ENCOUNTER — Ambulatory Visit (INDEPENDENT_AMBULATORY_CARE_PROVIDER_SITE_OTHER): Payer: No Typology Code available for payment source | Admitting: Vascular Surgery

## 2016-05-25 VITALS — BP 94/64 | HR 60 | Temp 97.2°F | Resp 14 | Ht 60.0 in | Wt 125.0 lb

## 2016-05-25 DIAGNOSIS — I83891 Varicose veins of right lower extremities with other complications: Secondary | ICD-10-CM

## 2016-05-25 DIAGNOSIS — I872 Venous insufficiency (chronic) (peripheral): Secondary | ICD-10-CM

## 2016-05-25 DIAGNOSIS — I83811 Varicose veins of right lower extremities with pain: Secondary | ICD-10-CM | POA: Insufficient documentation

## 2016-05-25 NOTE — Progress Notes (Signed)
Referred by:  Hilarie FredricksonJohn N Perry, MD [address] [place], [place] [address]  Reason for referral: right leg varicose veins   History of Present Illness  Linda Duncan is a 30 y.o. (September 13, 1986) female who presents with chief complaint: painful varicose veins in right leg.  History is obtained from translator.  Patient notes, onset of varicose veins after her second pregnancy.  The patient's symptoms include: painful swelling behind right knee.  The patient has had no history of DVT, known history of pregnancy, known history of varicose vein, no history of venous stasis ulcers, no history of  Lymphedema and no history of skin changes in lower legs.  There is no family history of venous disorders.  The patient has used compression stockings in the past for only one month  Past Medical History - suburethral cyst - cholecystitis  Past Surgical History  Procedure Laterality Date  . Cholecystectomy    . Urethral cyst removal N/A 08/17/2014    Procedure: RESECTION OF SUB- URETHRAL CYST;  Surgeon: Willodean Rosenthalarolyn Harraway-Smith, MD;  Location: [facility];  Service: Gynecology;  Laterality: N/A;    Social History   Social History  . Marital Status: Single    Spouse Name: N/A  . Number of Children: N/A  . Years of Education: N/A   Occupational History  . Not on file.   Social History Main Topics  . Smoking status: Never Smoker   . Smokeless tobacco: Never Used  . Alcohol Use: No  . Drug Use: No  . Sexual Activity: Yes    Birth Control/ Protection: Implant   Other Topics Concern  . Not on file   Social History Narrative    Family History: patient is unable to detail the medical history of his parents   Current Outpatient Prescriptions  Medication Sig Dispense Refill  . acetaminophen (TYLENOL) 325 MG tablet Take 325 mg by mouth every 6 (six) hours as needed for headache.    Clinical research associate. Elastic Bandages & Supports (MEDICAL COMPRESSION STOCKINGS) MISC 30 mm Hg for varicose veins 2 each 0  .  ibuprofen (ADVIL,MOTRIN) 600 MG tablet Take 1 tablet (600 mg total) by mouth every 6 (six) hours as needed. 20 tablet 0   No current facility-administered medications for this visit.    No Known Allergies   REVIEW OF SYSTEMS:  (Positives checked otherwise negative)  CARDIOVASCULAR:   [ ]  chest pain,  [ ]  chest pressure,  [ ]  palpitations,  [ ]  shortness of breath when laying flat,  [ ]  shortness of breath with exertion,   [ ]  pain in feet when walking,  [ ]  pain in feet when laying flat, [ ]  history of blood clot in veins (DVT),  [ ]  history of phlebitis,  [x]  swelling in legs,  [x]  varicose veins  PULMONARY:   [ ]  productive cough,  [ ]  asthma,  [ ]  wheezing  NEUROLOGIC:   [ ]  weakness in arms or legs,  [ ]  numbness in arms or legs,  [ ]  difficulty speaking or slurred speech,  [ ]  temporary loss of vision in one eye,  [ ]  dizziness  HEMATOLOGIC:   [ ]  bleeding problems,  [ ]  problems with blood clotting too easily  MUSCULOSKEL:   [ ]  joint pain, [ ]  joint swelling  GASTROINTEST:   [ ]  vomiting blood,  [ ]  blood in stool     GENITOURINARY:   [ ]  burning with urination,  [ ]  blood in urine  PSYCHIATRIC:    history of major depression  INTEGUMENTARY:    rashes,   ulcers  CONSTITUTIONAL:    fever,   chills   Physical Examination  Filed Vitals:   05/25/16 1425  BP: 94/64  Pulse: 60  Temp: 97.2 F (36.2 C)  TempSrc: Oral  Resp: 14  Height: 5' (1.524 m)  Weight: 125 lb (56.7 kg)  SpO2: 100%   Body mass index is 24.41 kg/(m^2).  General: A&O x 3, WD, thin  Head: [place]/AT  Ear/Nose/Throat: Hearing grossly intact, nares without erythema or drainage, oropharynx without Erythema/Exudate, Mallampati score: 3  Eyes: PERRLA, EOMI  Neck: Supple, no nuchal rigidity, no palpable LAD  Pulmonary: Sym exp, good air movt, CTAB, no rales, rhonchi, & wheezing  Cardiac: RRR, Nl S1, S2, no Murmurs, rubs or gallops  Vascular: Vessel  Right Left  Radial Palpable Palpable  Brachial Palpable Palpable  Carotid Palpable, without bruit Palpable, without bruit  Aorta Not palpable N/A  Femoral Palpable Palpable  Popliteal Not palpable Not palpable  PT Palpable Palpable  DP Palpable Palpable   Gastrointestinal: soft, NTND, no G/R, no HSM, no masses, no CVAT B  Musculoskeletal: M/S 5/5 throughout , Extremities without ischemic changes , no LDS, no edema, large cluster of varicose vein running from medial distal thigh to popliteal fossa  Neurologic: CN 2-12 intact , Pain and light touch intact in extremities , Motor exam as listed above  Psychiatric: Judgment intact, Mood & affect appropriate for pt's clinical situation  Dermatologic: See M/S exam for extremity exam, no rashes otherwise noted  Lymph : No Cervical, Axillary, or Inguinal lymphadenopathy    Non-Invasive Vascular Imaging  BLE Venous Insufficiency Duplex (Date: 05/25/2016):   RLE:   no DVT and SVT,   + GSV reflux: distal thigh  No SSV reflux,  + deep venous reflux: CFV, SFJ  LLE:  +CFV reflux  Outside Studies/Documentation 4 pages of outside documents were reviewed including: outpatient PCP chart.   Medical Decision Making  Linda Duncan is a 30 y.o. female who presents with: RLE chronic venous insufficiency (C2), varicose veins with complications   Based on the patient's history and examination, I recommend: compressive therapy.  I discussed with the patient the use of her 20-30 mm thigh high compression stockings and need for 3 month trial of such.  The patient will follow up in 3 months with my [facility] for evaluation for: EVLA R GSV vs stab phlebectomy.  Thank you for allowing [place] to participate in this patient's care.   Leonides Sake, MD [facility] Office: [phone] Pager: [phone]  05/25/2016, 2:53 PM

## 2016-08-15 ENCOUNTER — Ambulatory Visit: Payer: No Typology Code available for payment source

## 2016-08-28 ENCOUNTER — Ambulatory Visit: Payer: Self-pay | Attending: Family Medicine | Admitting: Family Medicine

## 2016-08-28 ENCOUNTER — Ambulatory Visit: Payer: No Typology Code available for payment source | Admitting: Vascular Surgery

## 2016-08-28 ENCOUNTER — Encounter: Payer: Self-pay | Admitting: Family Medicine

## 2016-08-28 VITALS — BP 99/64 | HR 72 | Temp 98.5°F | Ht 60.0 in | Wt 119.2 lb

## 2016-08-28 DIAGNOSIS — N921 Excessive and frequent menstruation with irregular cycle: Secondary | ICD-10-CM | POA: Insufficient documentation

## 2016-08-28 DIAGNOSIS — N946 Dysmenorrhea, unspecified: Secondary | ICD-10-CM | POA: Insufficient documentation

## 2016-08-28 DIAGNOSIS — N939 Abnormal uterine and vaginal bleeding, unspecified: Secondary | ICD-10-CM | POA: Insufficient documentation

## 2016-08-28 DIAGNOSIS — N760 Acute vaginitis: Secondary | ICD-10-CM

## 2016-08-28 DIAGNOSIS — B9689 Other specified bacterial agents as the cause of diseases classified elsewhere: Secondary | ICD-10-CM

## 2016-08-28 DIAGNOSIS — N926 Irregular menstruation, unspecified: Secondary | ICD-10-CM | POA: Insufficient documentation

## 2016-08-28 DIAGNOSIS — Z23 Encounter for immunization: Secondary | ICD-10-CM

## 2016-08-28 DIAGNOSIS — Z124 Encounter for screening for malignant neoplasm of cervix: Secondary | ICD-10-CM

## 2016-08-28 DIAGNOSIS — A499 Bacterial infection, unspecified: Secondary | ICD-10-CM

## 2016-08-28 MED ORDER — NORETHINDRONE-ETH ESTRADIOL 0.5-35 MG-MCG PO TABS: 1.0000 | ORAL_TABLET | Freq: Every day | ORAL | 3 refills | Status: DC

## 2016-08-28 NOTE — Progress Notes (Signed)
Subjective:  Patient ID: Linda Duncan, female    DOB: 01/21/1986  Age: 30 y.o. MRN: [id number]  CC: Dysmenorrhea   HPI Linda Duncan presents for   1. Menstrual problem: she has nexplanon in place for 4 years total, first one in place for 3 years. New one  in place for one year. She had a cyst removed in 08/2014 and since then she started having intermittent bleeding. She went to the health department and was prescribed birth control pills, brevicon.  She has heavy periods if she stops taking OCPs. She has intermittently stopped taking her birth control pills during this time. When she stops taking her pills she has heavy bleeding. Heaving bleeding is associated with hip pain.  She is not interested in conceiving any time soon. She does not want depo due to fear of weight gain.   Social History  Substance Use Topics  . Smoking status: Never Smoker  . Smokeless tobacco: Never Used  . Alcohol use No    Outpatient Medications Prior to Visit  Medication Sig Dispense Refill  . acetaminophen (TYLENOL) 325 MG tablet Take 325 mg by mouth every 6 (six) hours as needed for headache.    Clinical research associate Bandages & Supports (MEDICAL COMPRESSION STOCKINGS) MISC 30 mm Hg for varicose veins 2 each 0  . ibuprofen (ADVIL,MOTRIN) 600 MG tablet Take 1 tablet (600 mg total) by mouth every 6 (six) hours as needed. 20 tablet 0   No facility-administered medications prior to visit.     ROS Review of Systems  Constitutional: Negative for chills and fever.  Eyes: Negative for visual disturbance.  Respiratory: Negative for shortness of breath.   Cardiovascular: Negative for chest pain.  Gastrointestinal: Negative for abdominal pain and blood in stool.  Genitourinary: Positive for menstrual problem.  Musculoskeletal: Positive for arthralgias. Negative for back pain.  Skin: Negative for rash.  Allergic/Immunologic: Negative for immunocompromised state.  Hematological: Negative for adenopathy.  Does not bruise/bleed easily.  Psychiatric/Behavioral: Negative for dysphoric mood and suicidal ideas.    Objective:  BP 99/64 (BP Location: Left Arm, Patient Position: Sitting, Cuff Size: Small)   Pulse 72   Temp 98.5 F (36.9 C) (Oral)   Ht 5' (1.524 m)   Wt 119 lb 3.2 oz (54.1 kg)   SpO2 100%   BMI 23.28 kg/m   BP/Weight 08/28/2016 05/25/2016 02/24/2016  Systolic BP 99 94 102  Diastolic BP 64 64 66  Wt. (Lbs) 119.2 125 124  BMI 23.28 24.41 24.22   Physical Exam  Constitutional: She appears well-developed and well-nourished. No distress.  Cardiovascular: Normal rate, regular rhythm, normal heart sounds and intact distal pulses.   Pulmonary/Chest: Effort normal and breath sounds normal.  Genitourinary: Uterus normal. Pelvic exam was performed with patient prone. There is no rash, tenderness or lesion on the right labia. There is no rash, tenderness or lesion on the left labia. Cervix exhibits no motion tenderness, no discharge and no friability. There is bleeding in the vagina.  Musculoskeletal: She exhibits no edema.  Lymphadenopathy:       Right: No inguinal adenopathy present.       Left: No inguinal adenopathy present.  Skin: Skin is warm and dry. No rash noted.    Assessment & Plan:  Linda Duncan was seen today for dysmenorrhea.  Diagnoses and all orders for this visit:  Menstrual bleeding problem -     Cancel: POCT urine pregnancy  Pap smear for cervical cancer screening -     Cytology -  PAP  Menorrhagia with irregular cycle -     norethindrone-ethinyl estradiol (BREVICON, 28,) 0.5-35 MG-MCG tablet; Take 1 tablet by mouth daily.   There are no diagnoses linked to this encounter.  Meds ordered this encounter  Medications  . norethindrone-ethinyl estradiol (BREVICON, 28,) 0.5-35 MG-MCG tablet    Sig: Take 1 tablet by mouth daily.    Dispense:  3 Package    Refill:  3    Follow-up: No Follow-up on file.   Linda PhiJosalyn Zailynn Brandel MD

## 2016-08-28 NOTE — Patient Instructions (Addendum)
Linda Duncan was seen today for dysmenorrhea.  Diagnoses and all orders for this visit:  Menstrual bleeding problem -     Cancel: POCT urine pregnancy  Pap smear for cervical cancer screening -     Cytology - PAP  Menorrhagia with irregular cycle -     norethindrone-ethinyl estradiol (BREVICON, 28,) 0.5-35 MG-MCG tablet; Take 1 tablet by mouth daily.   F/u in 3 months for menorrhaiga   Dr. Armen PickupFunches

## 2016-08-28 NOTE — Assessment & Plan Note (Signed)
Heavy menstrual bleeding with hip pain on nexplanon Improved with OCP  Plan: Continue OCP

## 2016-08-28 NOTE — Progress Notes (Signed)
C/C pt has had constant bleeding and pain in the hip Pt states this has been going on for 4 months Pt has birth control pills and states once she stops taking them her bleeding get worse

## 2016-08-30 ENCOUNTER — Telehealth: Payer: Self-pay

## 2016-08-30 LAB — CERVICOVAGINAL ANCILLARY ONLY
CHLAMYDIA, DNA PROBE: NEGATIVE
Neisseria Gonorrhea: NEGATIVE
Wet Prep (BD Affirm): POSITIVE — AB

## 2016-08-30 MED ORDER — FLUCONAZOLE 150 MG PO TABS: 150.0000 mg | ORAL_TABLET | Freq: Once | ORAL | 0 refills | Status: AC

## 2016-08-30 MED ORDER — METRONIDAZOLE 500 MG PO TABS: 500.0000 mg | ORAL_TABLET | Freq: Two times a day (BID) | ORAL | 0 refills | Status: AC

## 2016-08-30 MED FILL — FLUCONAZOLE 150 MG TABLET: 150 | 1 days supply | Qty: 1 | Fill #0

## 2016-08-30 MED FILL — metroNIDAZOLE 500 MG TABS: 500 | 7 days supply | Qty: 14 | Fill #0

## 2016-08-30 NOTE — Telephone Encounter (Signed)
-----   Message from Dessa PhiJosalyn Funches, MD sent at 08/30/2016  1:28 PM EDT ----- Screening gc and chlamydia negative

## 2016-08-30 NOTE — Telephone Encounter (Signed)
-----   Message from Dessa PhiJosalyn Funches, MD sent at 08/30/2016  2:52 PM EDT ----- Bacterial vaginosis (BV) on wet prep This is not an STD This is an overgrowth of bacteria that can be treated with flagyl 500 mg twice daily followed by difucan 150 mg once to prevent yeast.  I have sent these to your pharmacy Do not mix flagyl with alcohol as this will cause stomach upset

## 2016-08-30 NOTE — Telephone Encounter (Signed)
nterpreter ID [phone]  [employer] received. [facility]

## 2016-08-30 NOTE — Telephone Encounter (Signed)
Interpreter ID [phone]  [employer] received. [facility].

## 2016-08-30 NOTE — Addendum Note (Signed)
Addended by: Dessa PhiFUNCHES, Morris Longenecker on: 08/30/2016 02:53 PM   Modules accepted: Orders

## 2016-08-31 ENCOUNTER — Telehealth: Payer: Self-pay | Admitting: Family Medicine

## 2016-08-31 LAB — CYTOLOGY - PAP

## 2016-08-31 NOTE — Telephone Encounter (Signed)
Return call to patient hipaa verif patient advised gonorrhea/Chlamydia negative; also advised per Dr. Merri BrunetteFunches-Bacterial vaginosis (BV) on wet prep  This is not an STD  This is an overgrowth of bacteria that can be treated with flagyl 500 mg twice daily followed by difucan 150 mg once to prevent yeast.  I have sent these to your pharmacy  Do not mix flagyl with alcohol as this will cause stomach upset.  Patient verbalized understanding.  Linda KennedyKim Nike Southwell, RN, BSN

## 2016-08-31 NOTE — Telephone Encounter (Signed)
Patient needs labs results Patient authorizes to leave voicemail

## 2016-09-04 ENCOUNTER — Encounter: Payer: Self-pay | Admitting: Vascular Surgery

## 2016-09-06 NOTE — Telephone Encounter (Signed)
Pt. Came into facility stating she could not understand when the nurse was giving her her lab results.  Pt. Would like a call back. Please f/u with pt.

## 2016-09-11 ENCOUNTER — Ambulatory Visit (INDEPENDENT_AMBULATORY_CARE_PROVIDER_SITE_OTHER): Payer: Self-pay | Admitting: Vascular Surgery

## 2016-09-11 ENCOUNTER — Encounter: Payer: Self-pay | Admitting: Vascular Surgery

## 2016-09-11 VITALS — BP 95/68 | HR 72 | Temp 97.8°F | Resp 14 | Ht 59.5 in | Wt 116.0 lb

## 2016-09-11 DIAGNOSIS — I83891 Varicose veins of right lower extremities with other complications: Secondary | ICD-10-CM

## 2016-09-11 NOTE — Progress Notes (Signed)
Subjective:     Patient ID: Linda Duncan, female   DOB: 1986-08-04, 30 y.o.   MRN: [id number]  HPI  This 30 year old female is accompanied by a Bah[place] interpreter. She has worn her long leg elastic compression stockings with no improvement in her symptoms. She has no history of DVT thrombophlebitis stasis ulcers or bleeding. She does have discomfort in the right posterior calf when standing.  Past Medical History:  Diagnosis Date  . Medical history non-contributory     Social History  Substance Use Topics  . Smoking status: Never Smoker  . Smokeless tobacco: Never Used  . Alcohol use No    No family history on file.  No Known Allergies   Current Outpatient Prescriptions:  .  acetaminophen (TYLENOL) 325 MG tablet, Take 325 mg by mouth every 6 (six) hours as needed for headache., Disp: , Rfl:  .  Elastic Bandages & Supports (MEDICAL COMPRESSION STOCKINGS) MISC, 30 mm Hg for varicose veins, Disp: 2 each, Rfl: 0 .  etonogestrel (NEXPLANON) 68 MG IMPL implant, 1 each by Subdermal route once., Disp: , Rfl:  .  ibuprofen (ADVIL,MOTRIN) 600 MG tablet, Take 1 tablet (600 mg total) by mouth every 6 (six) hours as needed., Disp: 20 tablet, Rfl: 0 .  norethindrone-ethinyl estradiol (BREVICON, 28,) 0.5-35 MG-MCG tablet, Take 1 tablet by mouth daily., Disp: 3 Package, Rfl: 3  Vitals:   09/11/16 1538  BP: 95/68  Pulse: 72  Resp: 14  Temp: 97.8 F (36.6 C)  SpO2: 99%  Weight: 116 lb (52.6 kg)  Height: 4' 11.5" (1.511 m)    Body mass index is 23.04 kg/m.         Review of Systems Denies chest pain, dyspnea on exertion, PND, orthopnea, hemoptysis    Objective:   Physical Exam BP 95/68 (BP Location: Left Arm, Patient Position: Sitting, Cuff Size: Normal)   Pulse 72   Temp 97.8 F (36.6 C)   Resp 14   Ht 4' 11.5" (1.511 m)   Wt 116 lb (52.6 kg)   SpO2 99%   BMI 23.04 kg/m   Gen. well-developed well-nourished female no apparent distress alert and oriented  3 Lungs no rhonchi or wheezing Right lower extremity with network of bulging varicosities over the popliteal fossa extending into the proximal calf and distal thigh. No distal edema noted. No hyperpigmentation or ulceration noted.  Previous venous duplex exam revealed no evidence of gross reflux in the right or left great saphenous veins or the right small saphenous vein was small caliber veins.     Assessment:     Focal painful varicosities right posterior calf    Plan:     Plan multiple stab phlebectomy of painful varicosities to relieve her symptoms

## 2016-09-14 NOTE — Telephone Encounter (Signed)
LVM to return call.

## 2016-09-21 ENCOUNTER — Ambulatory Visit: Payer: Self-pay | Attending: Family Medicine

## 2016-10-02 ENCOUNTER — Other Ambulatory Visit: Payer: Self-pay | Admitting: Vascular Surgery

## 2016-10-11 ENCOUNTER — Encounter: Payer: Self-pay | Admitting: Vascular Surgery

## 2016-10-16 ENCOUNTER — Encounter: Payer: Self-pay | Admitting: Vascular Surgery

## 2016-10-16 ENCOUNTER — Ambulatory Visit (INDEPENDENT_AMBULATORY_CARE_PROVIDER_SITE_OTHER): Payer: No Typology Code available for payment source | Admitting: Vascular Surgery

## 2016-10-16 VITALS — BP 105/70 | HR 66 | Resp 16 | Ht 59.5 in | Wt 115.0 lb

## 2016-10-16 DIAGNOSIS — I83891 Varicose veins of right lower extremities with other complications: Secondary | ICD-10-CM

## 2016-10-16 NOTE — Progress Notes (Signed)
    Stab Phlebectomy Procedure  Linda Duncan DOB:[birth year]  10/16/2016  Consent signed: Yes  Surgeon:J.D. Hart RochesterLawson  Procedure: stab phlebectomy: right leg  BP 105/70   Pulse 66   Resp 16   Ht 4' 11.5" (1.511 m)   Wt 115 lb (52.2 kg)   SpO2 98%   BMI 22.84 kg/m   Start time: 1pm   End time: 1:35pm   Tumescent Anesthesia: 130 cc 0.9% NaCl with 50 cc Lidocaine HCL with 1% Epi and 15 cc 8.4% NaHCO3  Local Anesthesia: 4 cc Lidocaine HCL and NaHCO3 (ratio 2:1)    Stab Phlebectomy: 10-20 Sites: Thigh and Calf  Patient tolerated procedure well: Yes  Notes:   Description of Procedure:  After marking the course of the secondary varicosities, the patient was placed on the operating table in the prone position, and the right leg was prepped and draped in sterile fashion.    The patient was then put into Trendelenburg position.  Local anesthetic was administered at the previously marked varicosities, and tumescent anesthesia was administered around the vessels.  Ten to 20 stab wounds were made using the tip of an 11 blade. And using the vein hook, the phlebectomies were performed using a hemostat to avulse the varicosities.  Adequate hemostasis was achieved, and steri strips were applied to the stab wound.      ABD pads and thigh high compression stockings were applied as well ace wraps where needed. Blood loss was less than 15 cc.  The patient ambulated out of the operating room having tolerated the procedure well.

## 2016-10-16 NOTE — Progress Notes (Signed)
Subjective:     Patient ID: Linda Duncan, female   DOB: 04/05/1986, 30 y.o.   MRN: [id number]  HPI This 30 year old female who was accompanied by a Spanish interpreter underwent multiple stab phlebectomy of painful varicosities in the right posterior thigh and calf around the popliteal fossa. This was performed under local tumescent anesthesia. She had approximately 15 phlebectomy's. She tolerated the procedure well.  Review of Systems     Objective:   Physical Exam BP 105/70   Pulse 66   Resp 16   Ht 4' 11.5" (1.511 m)   Wt 115 lb (52.2 kg)   SpO2 98%   BMI 22.84 kg/m        Assessment:     Well-tolerated multiple stab phlebectomy painful varicosities right popliteal fossa performed under local tumescent anesthesia-10-20    Plan:     Return in 3 months for final follow-up

## 2017-01-14 ENCOUNTER — Encounter: Payer: Self-pay | Admitting: Vascular Surgery

## 2017-01-21 ENCOUNTER — Ambulatory Visit: Payer: No Typology Code available for payment source | Admitting: Vascular Surgery

## 2017-01-25 ENCOUNTER — Encounter: Payer: Self-pay | Admitting: Vascular Surgery

## 2017-01-28 ENCOUNTER — Encounter: Payer: Self-pay | Admitting: Vascular Surgery

## 2017-02-04 ENCOUNTER — Ambulatory Visit (INDEPENDENT_AMBULATORY_CARE_PROVIDER_SITE_OTHER): Payer: Self-pay | Admitting: Vascular Surgery

## 2017-02-04 ENCOUNTER — Encounter: Payer: Self-pay | Admitting: Vascular Surgery

## 2017-02-04 VITALS — BP 98/56 | HR 77 | Temp 98.3°F | Resp 14 | Ht 59.0 in | Wt 116.0 lb

## 2017-02-04 DIAGNOSIS — I83891 Varicose veins of right lower extremities with other complications: Secondary | ICD-10-CM

## 2017-02-04 NOTE — Progress Notes (Signed)
Subjective:     Patient ID: Linda DikesAnalberta Castro-Nava, female   DOB: [birth year], 31 y.o.   MRN: [id number]  HPI This  31 year old female returns 3 months post-multiple stab phlebectomy of painful varicosities in the right posterior leg.She has had no pain. She is very pleased with the result. She has noticed a few small prominent veins in the area.  Review of Systems     Objective:   Physical Exam BP (!) 98/56 (BP Location: Left Arm, Patient Position: Sitting, Cuff Size: Normal)   Pulse 77   Temp 98.3 F (36.8 C)   Resp 14   Ht 4\' 11"  (1.499 m)   Wt 116 lb (52.6 kg)   SpO2 96%   BMI 23.43 kg/m   In a well-developed well-nourished female no apparent distress alert and oriented 3 Right leg with nicely healed stab phlebectomy sites and posterior calf and popliteal fossa. No bulging varicosities remain. No distal edema noted.     Assessment:     Status post stab phlebectomy of painful varicosities and right popliteal fossa-healing nicely and doing well    Plan:     Return when necessary basis

## 2017-05-01 ENCOUNTER — Encounter: Payer: Self-pay | Admitting: Family Medicine

## 2018-01-15 ENCOUNTER — Ambulatory Visit: Payer: Self-pay | Attending: Internal Medicine | Admitting: Physician Assistant

## 2018-01-15 VITALS — BP 109/71 | HR 77 | Temp 98.3°F | Resp 16 | Wt 115.6 lb

## 2018-01-15 DIAGNOSIS — Z79899 Other long term (current) drug therapy: Secondary | ICD-10-CM | POA: Insufficient documentation

## 2018-01-15 DIAGNOSIS — J069 Acute upper respiratory infection, unspecified: Secondary | ICD-10-CM | POA: Insufficient documentation

## 2018-01-15 DIAGNOSIS — Z789 Other specified health status: Secondary | ICD-10-CM

## 2018-01-15 MED ORDER — AZITHROMYCIN 250 MG PO TABS: ORAL_TABLET | ORAL | 0 refills | Status: DC

## 2018-01-15 MED ORDER — BENZONATATE 100 MG PO CAPS: 200.0000 mg | ORAL_CAPSULE | Freq: Three times a day (TID) | ORAL | 0 refills | Status: DC | PRN

## 2018-01-15 MED FILL — AZITHROMYCIN 250 MG TABLET: 250 | 5 days supply | Qty: 6 | Fill #0

## 2018-01-15 MED FILL — BENZONATATE 100 MG CAPSULE: 100 | 5 days supply | Qty: 30 | Fill #0

## 2018-01-15 NOTE — Progress Notes (Signed)
Patient ID: Linda DikesAnalberta Duncan, female   DOB: 1986-12-01, 32 y.o.   MRN: [id number]      Linda Duncan, is a 32 y.o. female  [id number]  [id number]  DOB - 1986-12-01  Subjective:  Chief Complaint and HPI: Linda Duncan is a 32 y.o. female here today for 2 week h/o cough, ST, and bodyaches.  Mucus is yellow and getting thicker.  No f/c. +sinus congestion  "Ana" with stratus interpreters translating   ROS:   Constitutional:  No f/c, No night sweats, No unexplained weight loss. EENT:  No vision changes, No blurry vision, No hearing changes. No other mouth, throat, or ear problems.  Respiratory: + cough, No SOB Cardiac: No CP, no palpitations GI:  No abd pain, No N/V/D. GU: No Urinary s/sx Musculoskeletal: No joint pain Neuro: No headache, no dizziness, no motor weakness.  Skin: No rash Endocrine:  No polydipsia. No polyuria.  Psych: Denies SI/HI  No problems updated.  ALLERGIES: No Known Allergies  PAST MEDICAL HISTORY: Past Medical History:  Diagnosis Date  . Medical history non-contributory     MEDICATIONS AT HOME: Prior to Admission medications   Medication Sig Start Date End Date Taking? Authorizing Provider  acetaminophen (TYLENOL) 325 MG tablet Take 325 mg by mouth every 6 (six) hours as needed for headache.    [provider]  azithromycin (ZITHROMAX) 250 MG tablet Take 2 today then 1 days 2-5 01/15/18   Anders SimmondsMcClung, Sabino Denning M, PA-C  benzonatate (TESSALON) 100 MG capsule Take 2 capsules (200 mg total) by mouth 3 (three) times daily as needed for cough. 01/15/18   Anders SimmondsMcClung, Adriahna Shearman M, PA-C  Elastic Bandages & Supports (MEDICAL COMPRESSION STOCKINGS) MISC 30 mm Hg for varicose veins 02/24/16   Funches, Gerilyn NestleJosalyn, MD  etonogestrel (NEXPLANON) 68 MG IMPL implant 1 each by Subdermal route once.    [provider]  ibuprofen (ADVIL,MOTRIN) 600 MG tablet Take 1 tablet (600 mg total) by mouth every 6 (six) hours as needed. 08/17/14    Willodean RosenthalHarraway-Smith, Carolyn, MD  norethindrone-ethinyl estradiol (BREVICON, 28,) 0.5-35 MG-MCG tablet Take 1 tablet by mouth daily. 08/28/16   Funches, Gerilyn NestleJosalyn, MD     Objective:  EXAM:   Vitals:   01/15/18 1532  BP: 109/71  Pulse: 77  Resp: 16  Temp: 98.3 F (36.8 C)  TempSrc: Oral  SpO2: 97%  Weight: 115 lb 9.6 oz (52.4 kg)    General appearance : A&OX3. NAD. Non-toxic-appearing HEENT: Atraumatic and Normocephalic.  PERRLA. EOM intact.  TM full B. Mouth-MMM, post pharynx WNL w/o erythema, No PND. +nasal congestion and clear rhinorhea Neck: supple, no JVD. No cervical lymphadenopathy. No thyromegaly Chest/Lungs:  Breathing-non-labored, Good air entry bilaterally, breath sounds normal without rales, rhonchi, or wheezing  CVS: S1 S2 regular, no murmurs, gallops, rubs  Extremities: Bilateral Lower Ext shows no edema, both legs are warm to touch with = pulse throughout Neurology:  CN II-XII grossly intact, Non focal.   Psych:  TP linear. J/I WNL. Normal speech. Appropriate eye contact and affect.  Skin:  No Rash  Data Review Lab Results  Component Value Date   HGBA1C 5.1 02/24/2016     Assessment & Plan   1. Upper respiratory tract infection, unspecified type Fluids, rest, respiratory care-will cover for atypicals due to length of illness.  - azithromycin (ZITHROMAX) 250 MG tablet; Take 2 today then 1 days 2-5  Dispense: 6 tablet; Refill: 0 - benzonatate (TESSALON) 100 MG capsule; Take 2 capsules (200 mg total) by mouth 3 (three)  times daily as needed for cough.  Dispense: 30 capsule; Refill: 0  2.  Language barrier-stratus interpreters used and additional time performing visit was required.  Patient have been counseled extensively about nutrition and exercise  Return for keep upcoming appt with Dr Laural Benes.  The patient was given clear instructions to go to ER or return to medical center if symptoms don't improve, worsen or new problems develop. The patient verbalized  understanding. The patient was told to call to get lab results if they haven't heard anything in the next week.     Georgian Co, PA-C [facility] [place], [place] [phone]   01/15/2018, 3:41 PM

## 2018-01-15 NOTE — Patient Instructions (Signed)
Infección del tracto respiratorio superior, adultos  (Upper Respiratory Infection, Adult)  La mayoría de las infecciones del tracto respiratorio superior están causadas por un virus. Un infección del tracto respiratorio superior afecta la nariz, la garganta y las vías respiratorias superiores. El tipo más común de infección del tracto respiratorio superior es el resfrío común.  CUIDADOS EN EL HOGAR  · Tome los medicamentos solamente como se lo haya indicado el médico.  · A fin de aliviar el dolor de garganta, haga gárgaras con solución salina templada o consuma caramelos para la tos, como se lo haya indicado el médico.  · Use un humidificador de vapor cálido o inhale el vapor de la ducha para aumentar la humedad del aire. Esto facilitará la respiración.  · Beba suficiente líquido para mantener el pis (orina) claro o de color amarillo pálido.  · Tome sopas y caldos transparentes.  · Siga una dieta saludable.  · Descanse todo lo que sea necesario.  · Regrese al trabajo cuando la fiebre haya desaparecido o el médico le diga que puede hacerlo.  ? Es posible que deba quedarse en su casa durante un tiempo prolongado, para no transmitir la infección a los demás.  ? También puede usar un barbijo y lavarse las manos con frecuencia para evitar el contagio del virus.  · Si tiene asma, use el inhalador con mayor frecuencia.  · No consuma ningún producto que contenga tabaco, lo que incluye cigarrillos, tabaco de mascar o cigarrillos electrónicos. Si necesita ayuda para dejar de fumar, consulte al médico.  SOLICITE AYUDA SI:  · Siente que empeora o que no mejora.  · Los medicamentos no logran aliviar los síntomas.  · Tiene escalofríos.  · La dificultad para respirar es peor.  · Tiene mucosidad marrón o roja.  · Tiene una secreción amarilla o marrón de la nariz.  · Le duele la cara, especialmente al inclinarse hacia adelante.  · Tiene fiebre.  · Tiene los ganglios del cuello hinchados.  · Siente dolor al tragar.   · Tiene zonas blancas en la parte de atrás de la garganta.  SOLICITE AYUDA DE INMEDIATO SI:  · Los siguientes síntomas son muy intensos o constantes:  ? Dolor de cabeza.  ? Dolor de oídos.  ? Dolor en la frente, detrás de los ojos y por encima de los pómulos (dolor sinusal).  ? Dolor en el pecho.  · Tiene enfermedad pulmonar prolongada (crónica) y cualquiera de estos síntomas:  ? Sibilancias.  ? Tos prolongada.  ? Tos con sangre.  ? Cambio en la mucosidad habitual.  · Presenta rigidez en el cuello.  · Tiene cambios en:  ? La visión.  ? La audición.  ? El pensamiento.  ? El estado de ánimo.  ASEGÚRESE DE QUE:  · Comprende estas instrucciones.  · Controlará su afección.  · Recibirá ayuda de inmediato si no mejora o si empeora.  Esta información no tiene como fin reemplazar el consejo del médico. Asegúrese de hacerle al médico cualquier pregunta que tenga.  Document Released: 05/07/2011 Document Revised: 04/19/2015 Document Reviewed: 03/10/2014  Elsevier Interactive Patient Education © 2018 Elsevier Inc.

## 2018-01-16 ENCOUNTER — Encounter: Payer: Self-pay | Admitting: Physician Assistant

## 2018-02-06 ENCOUNTER — Ambulatory Visit: Payer: Self-pay | Admitting: Internal Medicine

## 2018-08-05 ENCOUNTER — Ambulatory Visit: Payer: Self-pay | Admitting: Family Medicine

## 2018-08-06 ENCOUNTER — Encounter

## 2018-08-28 ENCOUNTER — Ambulatory Visit: Payer: Self-pay

## 2018-09-01 ENCOUNTER — Encounter: Payer: Self-pay | Admitting: Nurse Practitioner

## 2018-09-01 ENCOUNTER — Ambulatory Visit: Payer: Self-pay | Attending: Nurse Practitioner | Admitting: Nurse Practitioner

## 2018-09-01 VITALS — BP 101/67 | HR 72 | Temp 98.1°F | Ht 61.0 in | Wt 112.0 lb

## 2018-09-01 DIAGNOSIS — Z0000 Encounter for general adult medical examination without abnormal findings: Secondary | ICD-10-CM

## 2018-09-01 DIAGNOSIS — K0889 Other specified disorders of teeth and supporting structures: Secondary | ICD-10-CM | POA: Insufficient documentation

## 2018-09-01 DIAGNOSIS — S025XXA Fracture of tooth (traumatic), initial encounter for closed fracture: Secondary | ICD-10-CM | POA: Insufficient documentation

## 2018-09-01 DIAGNOSIS — K089 Disorder of teeth and supporting structures, unspecified: Secondary | ICD-10-CM

## 2018-09-01 DIAGNOSIS — Z23 Encounter for immunization: Secondary | ICD-10-CM

## 2018-09-01 NOTE — Progress Notes (Signed)
Assessment & Plan:  Linda Duncan was seen today for referral.  Diagnoses and all orders for this visit:  Poor dentition -     Ambulatory referral to Dentistry  Routine adult health maintenance -     CBC -     CMP14+EGFR -     Lipid panel    Patient has been counseled on age-appropriate routine health concerns for screening and prevention. These are reviewed and up-to-date. Referrals have been placed accordingly. Immunizations are up-to-date or declined.    Subjective:   Chief Complaint  Patient presents with  . Referral    Pt. is requesting if she can get a dental referral for her broken tooth and pain.    HPI Linda Duncan 32 y.o. female presents to office today to establish care. VRI was used to communicate directly with patient for the entire encounter including providing detailed patient instructions.   Review of Systems  Constitutional: Negative for fever, malaise/fatigue and weight loss.  HENT: Negative for nosebleeds.        TOOTH PAIN  Eyes: Negative.  Negative for blurred vision, double vision and photophobia.  Respiratory: Negative.  Negative for cough and shortness of breath.   Cardiovascular: Negative.  Negative for chest pain, palpitations and leg swelling.  Gastrointestinal: Negative.  Negative for heartburn, nausea and vomiting.  Musculoskeletal: Negative.  Negative for myalgias.  Neurological: Negative.  Negative for dizziness, focal weakness, seizures and headaches.  Psychiatric/Behavioral: Negative.  Negative for suicidal ideas.    Past Medical History:  Diagnosis Date  . Medical history non-contributory     Past Surgical History:  Procedure Laterality Date  . CHOLECYSTECTOMY    . URETHRAL CYST REMOVAL N/A 08/17/2014   Procedure: RESECTION OF SUB- URETHRAL CYST;  Surgeon: Lavonia Drafts, MD;  Location: [facility];  Service: Gynecology;  Laterality: N/A;  . VARICOSE VEIN SURGERY      Family History  Problem Relation Age of Onset  .  Hypertension Neg Hx   . Diabetes Neg Hx     Social History Reviewed with no changes to be made today.   Outpatient Medications Prior to Visit  Medication Sig Dispense Refill  . etonogestrel (NEXPLANON) 68 MG IMPL implant 1 each by Subdermal route once.    Marland Kitchen ibuprofen (ADVIL,MOTRIN) 600 MG tablet Take 1 tablet (600 mg total) by mouth every 6 (six) hours as needed. 20 tablet 0  . acetaminophen (TYLENOL) 325 MG tablet Take 325 mg by mouth every 6 (six) hours as needed for headache.    . norethindrone-ethinyl estradiol (BREVICON, 28,) 0.5-35 MG-MCG tablet Take 1 tablet by mouth daily. (Patient not taking: Reported on 09/01/2018) 3 Package 3  . azithromycin (ZITHROMAX) 250 MG tablet Take 2 today then 1 days 2-5 (Patient not taking: Reported on 09/01/2018) 6 tablet 0  . benzonatate (TESSALON) 100 MG capsule Take 2 capsules (200 mg total) by mouth 3 (three) times daily as needed for cough. (Patient not taking: Reported on 09/01/2018) 30 capsule 0  . Elastic Bandages & Supports (MEDICAL COMPRESSION STOCKINGS) MISC 30 mm Hg for varicose veins (Patient not taking: Reported on 09/01/2018) 2 each 0   No facility-administered medications prior to visit.     No Known Allergies     Objective:    BP 101/67 (BP Location: Left Arm, Patient Position: Sitting, Cuff Size: Normal)   Pulse 72   Temp 98.1 F (36.7 C) (Oral)   Ht '5\' 1"'$  (1.549 m)   Wt 112 lb (50.8 kg)   LMP  08/21/2018   SpO2 100%   BMI 21.16 kg/m  Wt Readings from Last 3 Encounters:  09/01/18 112 lb (50.8 kg)  01/15/18 115 lb 9.6 oz (52.4 kg)  02/04/17 116 lb (52.6 kg)    Physical Exam  Constitutional: She is oriented to person, place, and time. She appears well-developed and well-nourished. She is cooperative.  HENT:  Head: Normocephalic and atraumatic.  Mouth/Throat: Oropharynx is clear and moist and mucous membranes are normal. Abnormal dentition.    Eyes: EOM are normal.  Neck: Normal range of motion.  Cardiovascular: Normal  rate, regular rhythm and normal heart sounds. Exam reveals no gallop and no friction rub.  No murmur heard. Pulmonary/Chest: Effort normal and breath sounds normal. No tachypnea. No respiratory distress. She has no decreased breath sounds. She has no wheezes. She has no rhonchi. She has no rales. She exhibits no tenderness.  Abdominal: Bowel sounds are normal.  Musculoskeletal: Normal range of motion. She exhibits no edema.  Neurological: She is alert and oriented to person, place, and time. Coordination normal.  Skin: Skin is warm and dry.  Psychiatric: She has a normal mood and affect. Her behavior is normal. Judgment and thought content normal.  Nursing note and vitals reviewed.      Patient has been counseled extensively about nutrition and exercise as well as the importance of adherence with medications and regular follow-up. The patient was given clear instructions to go to ER or return to medical center if symptoms don't improve, worsen or new problems develop. The patient verbalized understanding.   Follow-up: Return if symptoms worsen or fail to improve.   Linda Pounds, FNP-BC [facility] [place], [place]   09/01/2018, 9:16 AM

## 2018-09-02 LAB — CMP14+EGFR
ALK PHOS: 45 IU/L (ref 39–117)
ALT: 12 IU/L (ref 0–32)
AST: 14 IU/L (ref 0–40)
Albumin/Globulin Ratio: 1.8 (ref 1.2–2.2)
Albumin: 4.5 g/dL (ref 3.5–5.5)
BILIRUBIN TOTAL: 0.4 mg/dL (ref 0.0–1.2)
BUN / CREAT RATIO: 20 (ref 9–23)
BUN: 13 mg/dL (ref 6–20)
CHLORIDE: 103 mmol/L (ref 96–106)
CO2: 23 mmol/L (ref 20–29)
Calcium: 9.4 mg/dL (ref 8.7–10.2)
Creatinine, Ser: 0.66 mg/dL (ref 0.57–1.00)
GFR calc Af Amer: 134 mL/min/{1.73_m2} (ref >59)
GFR calc non Af Amer: 116 mL/min/{1.73_m2} (ref >59)
GLUCOSE: 53 mg/dL — AB (ref 65–99)
Globulin, Total: 2.5 g/dL (ref 1.5–4.5)
Potassium: 3.9 mmol/L (ref 3.5–5.2)
Sodium: 141 mmol/L (ref 134–144)
Total Protein: 7 g/dL (ref 6.0–8.5)

## 2018-09-02 LAB — LIPID PANEL
CHOLESTEROL TOTAL: 183 mg/dL (ref 100–199)
Chol/HDL Ratio: 1.8 ratio (ref 0.0–4.4)
HDL: 100 mg/dL (ref >39)
LDL Calculated: 74 mg/dL (ref 0–99)
Triglycerides: 44 mg/dL (ref 0–149)
VLDL Cholesterol Cal: 9 mg/dL (ref 5–40)

## 2018-09-02 LAB — CBC
Hematocrit: 41 % (ref 34.0–46.6)
Hemoglobin: 13.7 g/dL (ref 11.1–15.9)
MCH: 29.7 pg (ref 26.6–33.0)
MCHC: 33.4 g/dL (ref 31.5–35.7)
MCV: 89 fL (ref 79–97)
PLATELETS: 215 10*3/uL (ref 150–450)
RBC: 4.61 x10E6/uL (ref 3.77–5.28)
RDW: 13.1 % (ref 12.3–15.4)
WBC: 4.7 10*3/uL (ref 3.4–10.8)

## 2018-09-04 ENCOUNTER — Telehealth: Payer: Self-pay

## 2018-09-04 NOTE — Telephone Encounter (Signed)
-----   Message from Claiborne RiggZelda W Fleming, NP sent at 09/02/2018  9:38 PM EDT ----- Labs are essentially normal except your glucose level is low. Please make sure you are eating 3 times per day and not skipping healthy meals or snacks

## 2018-09-04 NOTE — Telephone Encounter (Signed)
CMA attempt to reach patient to inform on results.  No answer and left a VM for patient to call back.  If patient call back, please inform:  Labs are essentially normal except your glucose level is low. Please make sure you are eating 3 times per day and not skipping healthy meals or snacks

## 2018-09-15 ENCOUNTER — Ambulatory Visit: Payer: Self-pay | Admitting: Family Medicine

## 2018-09-15 ENCOUNTER — Ambulatory Visit: Payer: Self-pay | Attending: Family Medicine | Admitting: Family Medicine

## 2018-09-15 VITALS — BP 107/65 | HR 75 | Temp 98.3°F | Resp 17 | Ht 61.0 in | Wt 108.8 lb

## 2018-09-15 DIAGNOSIS — M79602 Pain in left arm: Secondary | ICD-10-CM | POA: Insufficient documentation

## 2018-09-15 MED ORDER — PREDNISONE 20 MG PO TABS: 40.0000 mg | ORAL_TABLET | Freq: Every day | ORAL | 0 refills | Status: AC

## 2018-09-15 NOTE — Patient Instructions (Addendum)
Start prednisone tomorrow. You will take medication for a total of 5 days. Take medication with food to avoid stomach upset. If pain has not resolved after 5 days, return here for follow-up.   Comience prednisona maana. Tomar medicamentos por un total de 5 das. Tome medicamentos con alimentos para Acupuncturist. Si el dolor no se resuelve despus de [address], regrese aqu para Animator

## 2018-09-15 NOTE — Progress Notes (Signed)
Patient ID: Linda Duncan, female    DOB: 1986/08/21, 32 y.o.   MRN: [id number]  PCP: Claiborne Rigg, NP  Chief Complaint  Patient presents with  . Arm Pain    here with c/o L arm pain since receiving flu shot on 9/16. constant stabbing pain. rated 9/10. states that she is unable to move her arm like she usually does    Subjective:  HPI Linda Duncan is a 32 y.o. female presents for evaluation left arm pain Pain in left arm after receiving a flu shot 09/01/2018. No pain in left arm prior to shot.  She reports that the pain is actually increasing. Problems lifting objects and bathing.No numbness or tingling. Pain is radiating down left arm to elbow region. Denies rash or wound. Skin is not itchy. She gets a flu shot annually. No prior reactions. Social History   Socioeconomic History  . Marital status: Single    Spouse name: Not on file  . Number of children: Not on file  . Years of education: Not on file  . Highest education level: Not on file  Occupational History  . Not on file  Social Needs  . Financial resource strain: Not on file  . Food insecurity:    Worry: Not on file    Inability: Not on file  . Transportation needs:    Medical: Not on file    Non-medical: Not on file  Tobacco Use  . Smoking status: Never Smoker  . Smokeless tobacco: Never Used  Substance and Sexual Activity  . Alcohol use: No  . Drug use: No  . Sexual activity: Yes    Birth control/protection: Implant  Lifestyle  . Physical activity:    Days per week: Not on file    Minutes per session: Not on file  . Stress: Not on file  Relationships  . Social connections:    Talks on phone: Not on file    Gets together: Not on file    Attends religious service: Not on file    Active member of club or organization: Not on file    Attends meetings of clubs or organizations: Not on file    Relationship status: Not on file  . Intimate partner violence:    Fear of current or ex partner: Not  on file    Emotionally abused: Not on file    Physically abused: Not on file    Forced sexual activity: Not on file  Other Topics Concern  . Not on file  Social History Narrative  . Not on file    Family History  Problem Relation Age of Onset  . Hypertension Neg Hx   . Diabetes Neg Hx      Review of Systems  Patient Active Problem List   Diagnosis Date Noted  . Menorrhagia with irregular cycle 08/28/2016  . Chronic venous insufficiency 05/25/2016  . Varicose veins of right lower extremity with complications 02/24/2016  . Dyspareunia 03/25/2014    No Known Allergies  Prior to Admission medications   Medication Sig Start Date End Date Taking? Authorizing Provider  acetaminophen (TYLENOL) 325 MG tablet Take 325 mg by mouth every 6 (six) hours as needed for headache.   Yes [provider]  etonogestrel (NEXPLANON) 68 MG IMPL implant 1 each by Subdermal route once.   Yes [provider]    Past Medical, Surgical Family and Social History reviewed and updated.    Objective:   Today's Vitals   09/15/18 1602 09/15/18  1612  Height: 5\' 1"  (1.549 m)   PainSc:  9     Wt Readings from Last 3 Encounters:  09/01/18 112 lb (50.8 kg)  01/15/18 115 lb 9.6 oz (52.4 kg)  02/04/17 116 lb (52.6 kg)    Physical Exam General appearance: alert, well developed, well nourished, cooperative and in no distress Head: Normocephalic, without obvious abnormality, atraumatic Respiratory: Respirations even and unlabored, normal respiratory rate Heart: regular rate and rhythm. No gallops, murmurs, or extra heart sounds noted  Extremities: No gross deformities Skin: Skin color, texture, turgor normal. No rashes seen  Psych: Appropriate mood and affect. Neurologic: Mental status: Alert, oriented to person, place, and time, thought content appropriate.  Assessment & Plan:  1. Left arm pain, no trauma noted. Suspect inflammation as mild swelling in upper left arm compared to  right. Will treat with a course of prednisone 40 mg x 5 days.  Recommended alternation warm and cool compress applications. Tylenol okay for pain. Patient verbalized understanding and agreement with plan.  Meds ordered this encounter  Medications  . predniSONE (DELTASONE) 20 MG tablet    Sig: Take 2 tablets (40 mg total) by mouth daily with breakfast for 5 days.    Dispense:  10 tablet    Refill:  0    If symptoms worsen or do not improve, return for follow-up, follow-up with PCP, or at the emergency department if severity of symptoms warrant a higher level of care.   Interpreter service used to assist with translation during phone call.   Godfrey Pick. Tiburcio Pea, MSN, [facility]  [address] [place], [place], [place] [address] [phone]

## 2018-09-16 MED FILL — predniSONE 20 MG TABS: 20 | 5 days supply | Qty: 10 | Fill #0

## 2018-09-18 ENCOUNTER — Ambulatory Visit: Payer: Self-pay | Admitting: Family Medicine

## 2018-10-02 ENCOUNTER — Ambulatory Visit (HOSPITAL_COMMUNITY)
Admission: RE | Admit: 2018-10-02 | Discharge: 2018-10-02 | Disposition: A | Discharge status: Home / Self Care | Payer: Self-pay | Source: Ambulatory Visit | Attending: Family Medicine | Admitting: Family Medicine

## 2018-10-02 ENCOUNTER — Encounter: Payer: Self-pay | Admitting: Family Medicine

## 2018-10-02 ENCOUNTER — Ambulatory Visit (INDEPENDENT_AMBULATORY_CARE_PROVIDER_SITE_OTHER): Payer: Self-pay | Admitting: Family Medicine

## 2018-10-02 VITALS — BP 109/74 | HR 63 | Temp 97.4°F | Resp 17 | Ht 61.0 in | Wt 113.4 lb

## 2018-10-02 DIAGNOSIS — M25512 Pain in left shoulder: Secondary | ICD-10-CM

## 2018-10-02 DIAGNOSIS — M79602 Pain in left arm: Secondary | ICD-10-CM

## 2018-10-02 MED ORDER — ACETAMINOPHEN-CODEINE #3 300-30 MG PO TABS: 1.0000 | ORAL_TABLET | ORAL | 0 refills | Status: DC | PRN

## 2018-10-02 MED ORDER — PREDNISONE 20 MG PO TABS: ORAL_TABLET | ORAL | 0 refills | Status: DC

## 2018-10-02 MED FILL — predniSONE 20 MG TABS: 20 | 9 days supply | Qty: 18 | Fill #0

## 2018-10-02 MED FILL — ACETAMINOPHEN/COD #3 TABLET: 300-30 | 2 days supply | Qty: 15 | Fill #0

## 2018-10-02 NOTE — Patient Instructions (Addendum)
  Go to [facility] on [facility] [facility]. Go to the main entrance and ask the receptionist for the radiology department to obtain your x-rays today.  I will follow-up with you via phone regarding the results of your imaging.   Medications will be ready for pick-up at [facility]  Take Prednisone 20 mg,  in mornings with breakfast as follows:  Take 3 pills for 3 days, Take 2 pills for 3 days, and Take 1 pill for 3 days. Complete all medication.  Tylenol-codeine for pain. Only take for severe pain as medication can cause drowsiness.   Laurena Bering al hospital [facility] en [place]. Vaya a la entrada principal y pida a la recepcionista que el departamento de radiologa obtenga sus radiografas hoy.  Le seguir por telfono con respecto a los resultados de su imagen.   Los medicamentos estarn listos para su recogida en [facility]  Tomar Prednisona 20 mg, en las maanas con el desayuno de la siguiente [place]: Tomar 3 pldoras para 3 [address]  [address], Tomar 2 pldoras para [address], y Tomar 1 pldora para [address]. Completa todos los medicamentos.  Tylenol-codena para Chief Technology Officer. Solo debe tomar para Chief Technology Officer intenso, ya que los medicamentos pueden causar somnolencia.

## 2018-10-02 NOTE — Progress Notes (Signed)
Patient ID: Linda Duncan, female    DOB: 06-Oct-1986, 32 y.o.   MRN: [id number]  PCP: Claiborne Rigg, NP  Chief Complaint  Patient presents with  . Arm Pain    still having c/o of L arm pain    Subjective:  HPI Linda Duncan is a 32 y.o. female presents for evaluation left arm. Reports upon completion of prednisone, pain to left arm returned and is now worsened. She is unable to lift her left arm. Denies numbness or tingling. Unable to lift left arm up due to severe pain. Rates pain 9/10 and characterizes pain as throbbing. Unble to sleep as she experiences a throbbing type at rest or with activity. She has attempted relief with tylenol with minimal improvement. She is unable to work or care for her children. Of note she has a  Nexplanon placed in the left arm by [facility] over 12 months ago.  Social History   Socioeconomic History  . Marital status: Single    Spouse name: Not on file  . Number of children: Not on file  . Years of education: Not on file  . Highest education level: Not on file  Occupational History  . Not on file  Social Needs  . Financial resource strain: Not on file  . Food insecurity:    Worry: Not on file    Inability: Not on file  . Transportation needs:    Medical: Not on file    Non-medical: Not on file  Tobacco Use  . Smoking status: Never Smoker  . Smokeless tobacco: Never Used  Substance and Sexual Activity  . Alcohol use: No  . Drug use: No  . Sexual activity: Yes    Birth control/protection: Implant  Lifestyle  . Physical activity:    Days per week: Not on file    Minutes per session: Not on file  . Stress: Not on file  Relationships  . Social connections:    Talks on phone: Not on file    Gets together: Not on file    Attends religious service: Not on file    Active member of club or organization: Not on file    Attends meetings of clubs or organizations: Not on file    Relationship status: Not on file   . Intimate partner violence:    Fear of current or ex partner: Not on file    Emotionally abused: Not on file    Physically abused: Not on file    Forced sexual activity: Not on file  Other Topics Concern  . Not on file  Social History Narrative  . Not on file    Family History  Problem Relation Age of Onset  . Hypertension Neg Hx   . Diabetes Neg Hx    Review of Systems Pertinent negatives listed in HPI Patient Active Problem List   Diagnosis Date Noted  . Menorrhagia with irregular cycle 08/28/2016  . Chronic venous insufficiency 05/25/2016  . Varicose veins of right lower extremity with complications 02/24/2016  . Dyspareunia 03/25/2014    No Known Allergies  Prior to Admission medications   Medication Sig Start Date End Date Taking? Authorizing Provider  acetaminophen (TYLENOL) 325 MG tablet Take 325 mg by mouth every 6 (six) hours as needed for headache.   Yes [provider]  etonogestrel (NEXPLANON) 68 MG IMPL implant 1 each by Subdermal route once. In left arm   Yes [provider]   Past Medical, Surgical Family and Social  History reviewed and updated.   Objective:   Today's Vitals   10/02/18 1000  BP: 109/74  Pulse: 63  Resp: 17  Temp: (!) 97.4 F (36.3 C)  TempSrc: Oral  SpO2: 99%  Weight: 113 lb 6.4 oz (51.4 kg)  Height: 5\' 1"  (1.549 m)    Wt Readings from Last 3 Encounters:  10/02/18 113 lb 6.4 oz (51.4 kg)  09/15/18 108 lb 12.8 oz (49.4 kg)  09/01/18 112 lb (50.8 kg)   Physical Exam General appearance: alert, well developed, well nourished, cooperative and in no distress Head: Normocephalic, without obvious abnormality, atraumatic Respiratory: Respirations even and unlabored, normal respiratory rate Heart: rate and rhythm normal. No gallop or murmurs noted on exam  Musculoskeletal:  Left Arm: Patient demonstrating difficulty with extending and flexing left arm. Skin color is normal-no bruising. Positive for bony  tenderness from brachial region extending to the left clavicle. Nexplanon is palpable and in appropriate location of left inner upper arm.  Psych: Appropriate mood and affect. Neurologic: Mental status: Alert, oriented to person, place, and time, thought content appropriate. Bilateral hand grips 5/5. Strength BUE 5/5 Lab Results  Component Value Date   HGBA1C 5.1 02/24/2016      Assessment & Plan:  1. Left arm pain 2. Acute pain of left shoulder It is unclear as to the etiology of patient's pain and the fact that it is subsequently worsening after treatment with prednisone to the point that she is reporting immobility of left arm. She is now complaining of left shoulder pain associated with the left arm pain.  Obtaining an x-ray today of the left arm and left shoulder.  Placing on an extended taper of prednisone. Nexplanon is palpable medial region of her left upper arm.  If imaging does not provide an explanation for her symptoms,  will refer patient to orthopedics for second opinion and evaluation.   A total of 20  minutes spent, greater than 50 % of this time was spent counseling and coordination of care.   -The patient was given clear instructions to go to ER or return to medical center if symptoms do not improve, worsen or new problems develop. The patient verbalized understanding.    Joaquin Courts, FNP Primary Care at [facility] [address], [place] [address] [phone]: [phone]

## 2018-10-02 NOTE — Addendum Note (Signed)
Addended by: Bing Neighbors on: 10/02/2018 02:31 PM   Modules accepted: Orders

## 2018-10-07 ENCOUNTER — Telehealth: Payer: Self-pay | Admitting: Family Medicine

## 2018-10-07 NOTE — Telephone Encounter (Signed)
I spoke to the patient with Aldean Jewett [employer] office lead) as interpreter regarding her left arm pain which she attributed to commencing after her flu shot.  She denies erythema, swelling but states she does have some warmth and difficulty elevating her left arm but symptoms are controlled with the pain medication she received by the nurse practitioner. Advised to apply ice and continue her medications.  She is to notify the clinic if symptoms worsen; meanwhile orthopedic referral had been placed at her last visit with the nurse practitioner.  She was satisfied with the handling of the situation.

## 2018-10-08 ENCOUNTER — Ambulatory Visit: Payer: Self-pay

## 2018-10-09 ENCOUNTER — Encounter

## 2018-10-09 ENCOUNTER — Ambulatory Visit: Payer: Self-pay

## 2018-10-26 IMAGING — CR DG HUMERUS 2V *L*
2 series · 2 of 2 positions shown · non-contrast
Comparison: None.

CLINICAL DATA: Left arm pain after flu shot.

EXAM:
LEFT HUMERUS - 2+ VIEW

[humerus ap]
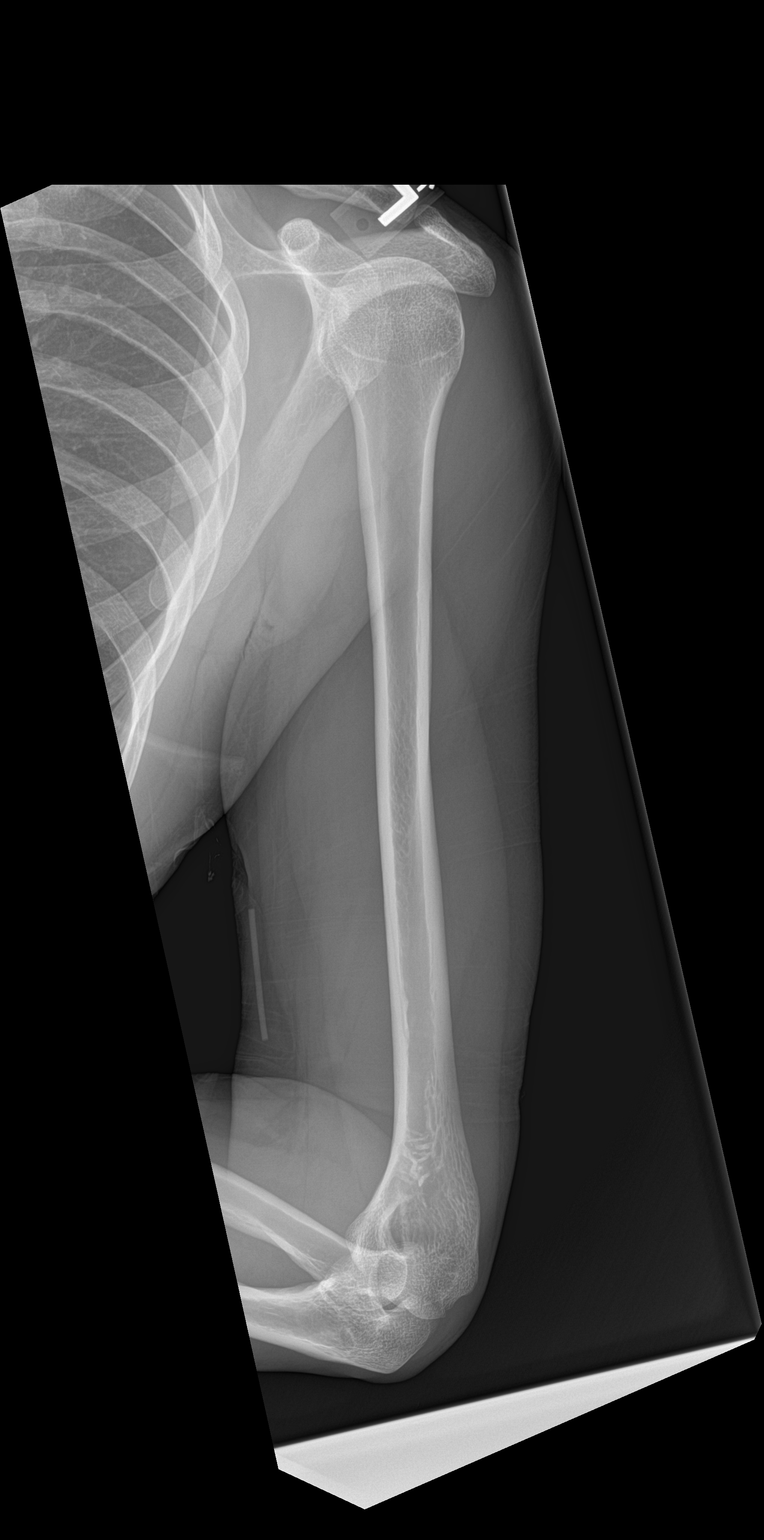

[humerus lat]
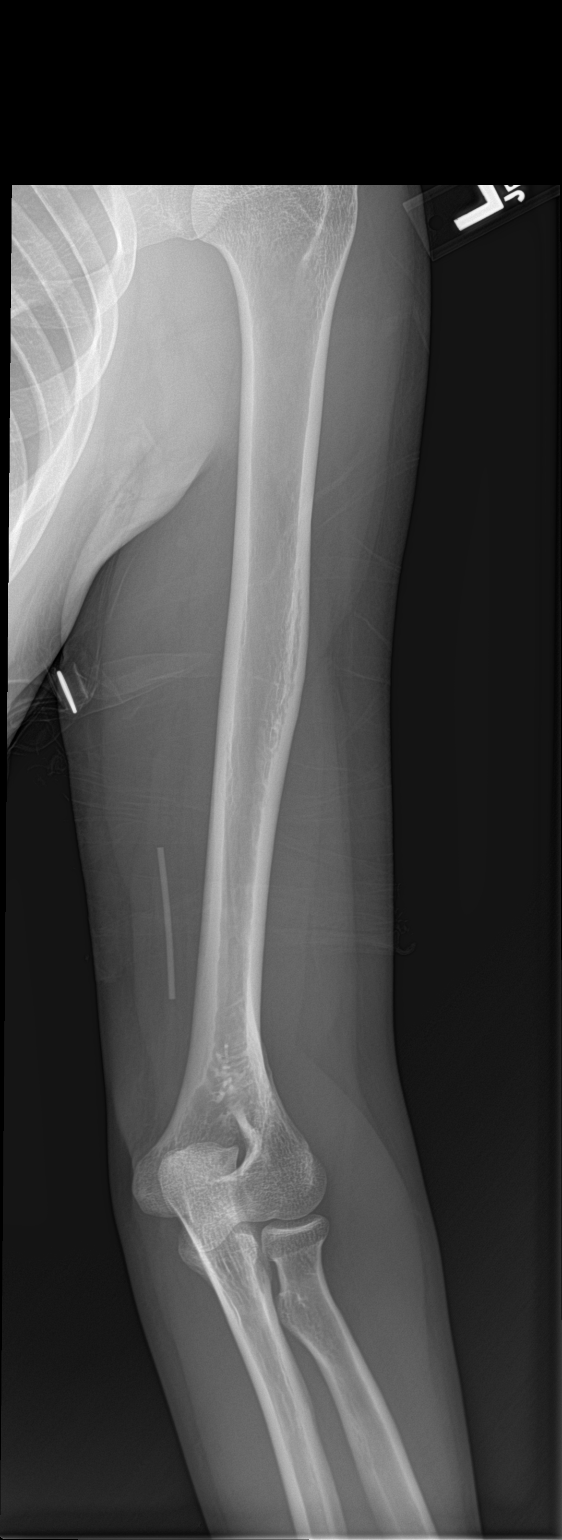

[2 of 2 positions shown; findings below may reference images not displayed]

FINDINGS: There is no evidence of fracture or other focal bone lesions.
Probable contraceptive implant seen in medial soft tissues.
IMPRESSION: Normal left humerus.

## 2018-10-27 ENCOUNTER — Encounter (INDEPENDENT_AMBULATORY_CARE_PROVIDER_SITE_OTHER): Payer: Self-pay | Admitting: Orthopaedic Surgery

## 2018-10-27 ENCOUNTER — Ambulatory Visit (INDEPENDENT_AMBULATORY_CARE_PROVIDER_SITE_OTHER): Payer: Self-pay | Admitting: Orthopaedic Surgery

## 2018-10-27 ENCOUNTER — Ambulatory Visit (INDEPENDENT_AMBULATORY_CARE_PROVIDER_SITE_OTHER): Payer: Self-pay

## 2018-10-27 VITALS — BP 105/73 | HR 77 | Ht 61.0 in | Wt 113.0 lb

## 2018-10-27 DIAGNOSIS — M25512 Pain in left shoulder: Secondary | ICD-10-CM

## 2018-10-27 MED ORDER — BUPIVACAINE HCL 0.5 % IJ SOLN
2.0000 mL | INTRAMUSCULAR | Status: AC | PRN
  Administered 2018-10-27: 2 mL via INTRA_ARTICULAR

## 2018-10-27 MED ORDER — METHYLPREDNISOLONE ACETATE 40 MG/ML IJ SUSP
80.0000 mg | INTRAMUSCULAR | Status: AC | PRN
  Administered 2018-10-27: 80 mg

## 2018-10-27 MED ORDER — LIDOCAINE HCL 2 % IJ SOLN
2.0000 mL | INTRAMUSCULAR | Status: AC | PRN
  Administered 2018-10-27: 2 mL

## 2018-10-27 NOTE — Progress Notes (Signed)
Office Visit Note   Patient: Linda Duncan           Date of Birth: 25-Oct-1986           MRN: [id number] Visit Date: 10/27/2018              Requested by: Bing Neighbors, FNP [address] [place], [place] [address] PCP: Claiborne Rigg, NP   Assessment & Plan: Visit Diagnoses:  1. Acute pain of left shoulder     Plan: Impingement syndrome left shoulder.  I injected the subacromial space with Marcaine, Xylocaine and Depo-Medrol with immediate relief of her pain.  We will reevaluate in 3 to 4 weeks if no improvement  Follow-Up Instructions: Return if symptoms worsen or fail to improve.   Orders:  Orders Placed This Encounter  Procedures  . Large Joint Inj: L subacromial bursa   No orders of the defined types were placed in this encounter.     Procedures: Large Joint Inj: L subacromial bursa on 10/27/2018 10:40 AM Indications: pain and diagnostic evaluation Details: 25 G 1.5 in needle, anterolateral approach  Arthrogram: No  Medications: 2 mL lidocaine 2 %; 2 mL bupivacaine 0.5 %; 80 mg methylPREDNISolone acetate 40 MG/ML Consent was given by the patient. Immediately prior to procedure a time out was called to verify the correct patient, procedure, equipment, support staff and site/side marked as required. Patient was prepped and draped in the usual sterile fashion.       Clinical Data: No additional findings.   Subjective: Chief Complaint  Patient presents with  . New Patient (Initial Visit)    L SHOULDER PAIN THAT RADIATES DOWN TO LEFT ELBOW, STARTED AFTER FLU SHOT 5 WEEKS AGO  32 year old female accompanied by family member and an interpreter for evaluation of left shoulder pain.  First noted onset about a week after receiving the flu shot 2 months ago.  Has had difficulty with sleeping on that side and raising her arm over her head.  No fever chills or skin rashes.  No numbness or tingling.  Pain seems to localize along the anterior lateral  deltoid and subacromial region.  No neck pain.  He or trauma  HPI  Review of Systems  Constitutional: Negative for fatigue and fever.  HENT: Negative for ear pain.   Eyes: Negative for pain.  Respiratory: Negative for cough and shortness of breath.   Cardiovascular: Negative for leg swelling.  Gastrointestinal: Negative for constipation and diarrhea.  Genitourinary: Negative for difficulty urinating.  Musculoskeletal: Negative for back pain and neck pain.  Skin: Negative for rash.  Allergic/Immunologic: Negative for food allergies.  Neurological: Positive for weakness. Negative for numbness.  Hematological: Negative for adenopathy.  Psychiatric/Behavioral: Positive for sleep disturbance.     Objective: Vital Signs: BP 105/73 (BP Location: Left Arm, Patient Position: Sitting, Cuff Size: Normal)   Pulse 77   Ht 5\' 1"  (1.549 m)   Wt 113 lb (51.3 kg)   BMI 21.35 kg/m   Physical Exam  Constitutional: She is oriented to person, place, and time. She appears well-developed and well-nourished.  HENT:  Mouth/Throat: Oropharynx is clear and moist.  Eyes: Pupils are equal, round, and reactive to light. EOM are normal.  Pulmonary/Chest: Effort normal.  Neurological: She is alert and oriented to person, place, and time.  Skin: Skin is warm and dry.  Psychiatric: She has a normal mood and affect. Her behavior is normal.    Ortho Exam awake alert and oriented x3.  Comfortable sitting.  Positive impingement.  Pain with any attempted overhead motion left shoulder.  Skin intact.  No ecchymosis.  No erythema or induration.  Some pain in the anterior subacromial region and over the anterior and lateral deltoid.  Some loss of motion in flexion and internal rotation based on her pain.  After Marcaine Xylocaine and Depo-Medrol injection had no pain with full motion  Specialty Comments:  No specialty comments available.  Imaging: No results found.   PMFS History: Patient Active Problem List     Diagnosis Date Noted  . Menorrhagia with irregular cycle 08/28/2016  . Chronic venous insufficiency 05/25/2016  . Varicose veins of right lower extremity with complications 02/24/2016  . Dyspareunia 03/25/2014   Past Medical History:  Diagnosis Date  . Medical history non-contributory   . Varicose vein of leg     Family History  Problem Relation Age of Onset  . Hypertension Neg Hx   . Diabetes Neg Hx     Past Surgical History:  Procedure Laterality Date  . CHOLECYSTECTOMY    . URETHRAL CYST REMOVAL N/A 08/17/2014   Procedure: RESECTION OF SUB- URETHRAL CYST;  Surgeon: Willodean Rosenthal, MD;  Location: [facility];  Service: Gynecology;  Laterality: N/A;  . VARICOSE VEIN SURGERY     Social History   Occupational History  . Not on file  Tobacco Use  . Smoking status: Never Smoker  . Smokeless tobacco: Never Used  Substance and Sexual Activity  . Alcohol use: No  . Drug use: No  . Sexual activity: Yes    Birth control/protection: Implant

## 2019-04-29 ENCOUNTER — Telehealth (HOSPITAL_BASED_OUTPATIENT_CLINIC_OR_DEPARTMENT_OTHER): Payer: Self-pay

## 2019-04-29 DIAGNOSIS — N760 Acute vaginitis: Secondary | ICD-10-CM

## 2019-04-29 LAB — POCT URINALYSIS DIP (CLINITEK)
Bilirubin, UA: NEGATIVE
Glucose, UA: NEGATIVE mg/dL
Ketones, POC UA: NEGATIVE mg/dL
Nitrite, UA: NEGATIVE
POC PROTEIN,UA: NEGATIVE
Spec Grav, UA: <=1.005 — AB (ref 1.010–1.025)
Urobilinogen, UA: 0.2 E.U./dL
pH, UA: 5 (ref 5.0–8.0)

## 2019-04-29 NOTE — Telephone Encounter (Signed)
Pt. Came to drop off urine for Vaginal infection.

## 2019-04-30 LAB — URINE CYTOLOGY ANCILLARY ONLY
Chlamydia: NEGATIVE
Neisseria Gonorrhea: NEGATIVE
Trichomonas: NEGATIVE

## 2019-05-01 ENCOUNTER — Other Ambulatory Visit: Payer: Self-pay

## 2019-05-01 ENCOUNTER — Ambulatory Visit: Payer: Self-pay | Attending: Nurse Practitioner | Admitting: Nurse Practitioner

## 2019-05-01 ENCOUNTER — Encounter: Payer: Self-pay | Admitting: Nurse Practitioner

## 2019-05-01 DIAGNOSIS — N898 Other specified noninflammatory disorders of vagina: Secondary | ICD-10-CM

## 2019-05-01 NOTE — Progress Notes (Signed)
Virtual Visit via Telephone Note Due to national recommendations of social distancing due to COVID 19, telehealth visit is felt to be most appropriate for this patient at this time.  I discussed the limitations, risks, security and privacy concerns of performing an evaluation and management service by telephone and the availability of in person appointments. I also discussed with the patient that there may be a patient responsible charge related to this service. The patient expressed understanding and agreed to proceed.    I connected with Linda Duncan on 05/01/19  at 2:39 PM EDT  EDT by telephone and verified that I am speaking with the correct person using two identifiers.   Consent I discussed the limitations, risks, security and privacy concerns of performing an evaluation and management service by telephone and the availability of in person appointments. I also discussed with the patient that there may be a patient responsible charge related to this service. The patient expressed understanding and agreed to proceed.   Location of Patient: Private  Residence   [facility]    Persons participating in Telemedicine visit: Linda DenverZelda Duncan Learn FNP-BC [facility] CMA Genna Dellia [employer]  Spanish ID interpreter [phone]   History of Present Illness: Telemedicine visit for: Urinary frequency.  GU SYMPTOMS She has symptoms of burning sensation and incomplete bladder emptying as well urinary frequency. However recent Urinalysis is negative for UTI aside from trace leukocytes. She does have a history of vaginal inclusion cyst which was removed surgically several years ago. Will need repeat US to verify if urethral cyst has returned. If so she will need to see GYN. She notes current symptoms are similar to previous inclusion cyst symptoms. Denies menorrhagia or metrorrhagia. She denies any vaginitis symptoms such as discharge, odor, itching.     Past Medical History:  Diagnosis Date  . Medical history non-contributory   . Varicose vein of leg     Past Surgical History:  Procedure Laterality Date  . CHOLECYSTECTOMY    . URETHRAL CYST REMOVAL N/A 08/17/2014   Procedure: RESECTION OF SUB- URETHRAL CYST;  Surgeon: Willodean Rosenthalarolyn Harraway-Smith, MD;  Location: [facility];  Service: Gynecology;  Laterality: N/A;  . VARICOSE VEIN SURGERY      Family History  Problem Relation Age of Onset  . Hypertension Neg Hx   . Diabetes Neg Hx     Social History   Socioeconomic History  . Marital status: Single    Spouse name: Not on file  . Number of children: Not on file  . Years of education: Not on file  . Highest education level: Not on file  Occupational History  . Not on file  Social Needs  . Financial resource strain: Not on file  . Food insecurity:    Worry: Not on file    Inability: Not on file  . Transportation needs:    Medical: Not on file    Non-medical: Not on file  Tobacco Use  . Smoking status: Never Smoker  . Smokeless tobacco: Never Used  Substance and Sexual Activity  . Alcohol use: No  . Drug use: No  . Sexual activity: Yes    Birth control/protection: Implant  Lifestyle  . Physical activity:    Days per week: Not on file    Minutes per session: Not on file  . Stress: Not on file  Relationships  . Social connections:    Talks on phone: Not on file    Gets together: Not on file  Attends religious service: Not on file    Active member of club or organization: Not on file    Attends meetings of clubs or organizations: Not on file    Relationship status: Not on file  Other Topics Concern  . Not on file  Social History Narrative  . Not on file     Observations/Objective: Awake, alert and oriented x 3   Review of Systems  Constitutional: Negative for fever, malaise/fatigue and weight loss.  HENT: Negative.  Negative for nosebleeds.   Eyes: Negative.  Negative for blurred vision, double vision and  photophobia.  Respiratory: Negative.  Negative for cough and shortness of breath.   Cardiovascular: Negative.  Negative for chest pain, palpitations and leg swelling.  Gastrointestinal: Negative.  Negative for heartburn, nausea and vomiting.  Genitourinary: Positive for dysuria, frequency and urgency. Negative for flank pain and hematuria.       SEE HPI  Musculoskeletal: Negative.  Negative for myalgias.  Neurological: Negative.  Negative for dizziness, focal weakness, seizures and headaches.  Psychiatric/Behavioral: Negative.  Negative for suicidal ideas.    Assessment and Plan:  Diagnoses and all orders for this visit:  Vaginal inclusion cyst -     US PELVIC COMPLETE WITH TRANSVAGINAL; Future     Follow Up Instructions Return in about 3 months (around 08/01/2019), or if symptoms worsen or fail to improve.     I discussed the assessment and treatment plan with the patient. The patient was provided an opportunity to ask questions and all were answered. The patient agreed with the plan and demonstrated an understanding of the instructions.   The patient was advised to call back or seek an in-person evaluation if the symptoms worsen or if the condition fails to improve as anticipated.  I provided 26 minutes of non-face-to-face time during this encounter including median intraservice time, reviewing previous notes, labs, imaging, medications and explaining diagnosis and management.  Claiborne Rigg, FNP-BC

## 2019-05-02 LAB — URINE CYTOLOGY ANCILLARY ONLY
Bacterial vaginitis: POSITIVE — AB
Candida vaginitis: NEGATIVE

## 2019-05-05 ENCOUNTER — Ambulatory Visit (HOSPITAL_COMMUNITY)
Admission: RE | Admit: 2019-05-05 | Discharge: 2019-05-05 | Disposition: A | Discharge status: Home / Self Care | Payer: Self-pay | Source: Ambulatory Visit | Attending: Nurse Practitioner | Admitting: Nurse Practitioner

## 2019-05-05 DIAGNOSIS — N898 Other specified noninflammatory disorders of vagina: Secondary | ICD-10-CM

## 2019-05-06 ENCOUNTER — Other Ambulatory Visit: Payer: Self-pay | Admitting: Nurse Practitioner

## 2019-05-06 MED ORDER — METRONIDAZOLE 500 MG PO TABS: 500.0000 mg | ORAL_TABLET | Freq: Two times a day (BID) | ORAL | 0 refills | Status: AC

## 2019-05-06 MED FILL — metroNIDAZOLE 500 MG TABS: 500 | 7 days supply | Qty: 14 | Fill #0

## 2019-05-07 ENCOUNTER — Telehealth: Payer: Self-pay

## 2019-05-07 NOTE — Telephone Encounter (Signed)
-----   Message from Claiborne Rigg, NP sent at 05/06/2019  9:13 AM EDT ----- Urine positive for bacterial vaginosis. Will send prescription to the pharmacy

## 2019-05-07 NOTE — Telephone Encounter (Signed)
CMA attempt to reach patient to inform on results.  No answer and left a VM to call back . 

## 2019-05-07 NOTE — Telephone Encounter (Signed)
CMA spoke to patient to inform on lab results and ultrasounds.   Spanish interpreter assist with the call.

## 2019-05-07 NOTE — Telephone Encounter (Signed)
-----   Message from Claiborne Rigg, NP sent at 05/06/2019  9:14 AM EDT ----- Pelvic ultrasound does not show recurrent vaginal cyst.

## 2019-05-07 NOTE — Telephone Encounter (Signed)
Patient called back to get their results please follow up. °

## 2019-05-29 IMAGING — US US PELVIS COMPLETE WITH TRANSVAGINAL
1 series · 13 of 25 positions shown · non-contrast
Comparison: Prior ultrasound from 03/31/2014.

CLINICAL DATA: Initial evaluation for Bartholin cyst or recurrent
vaginal inclusion cyst



[Series 1: us pelvis complete with transvaginal · 13 of 42 slices shown]
[im 1/42]
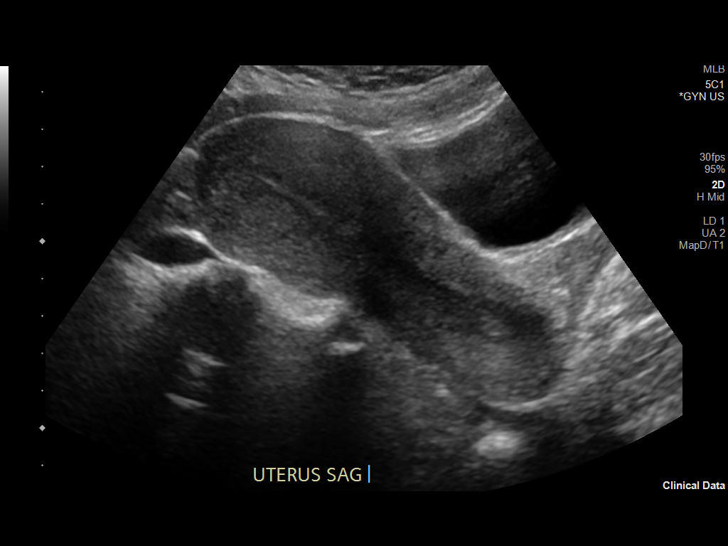
[im 4/42]
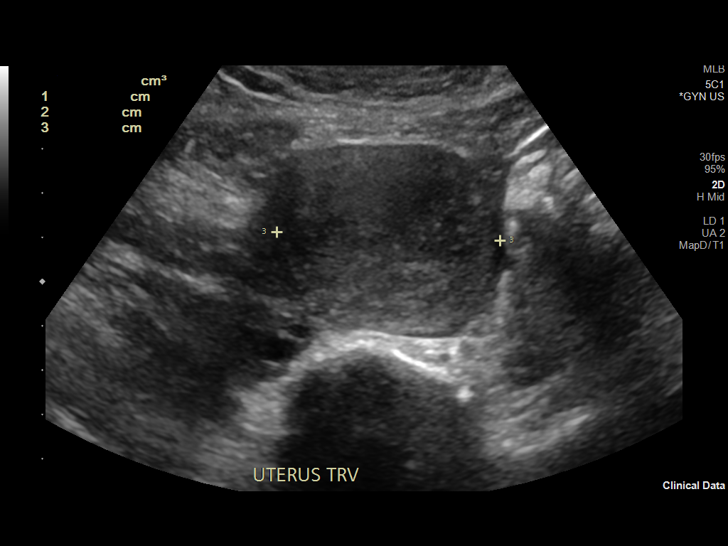
[im 7/42]
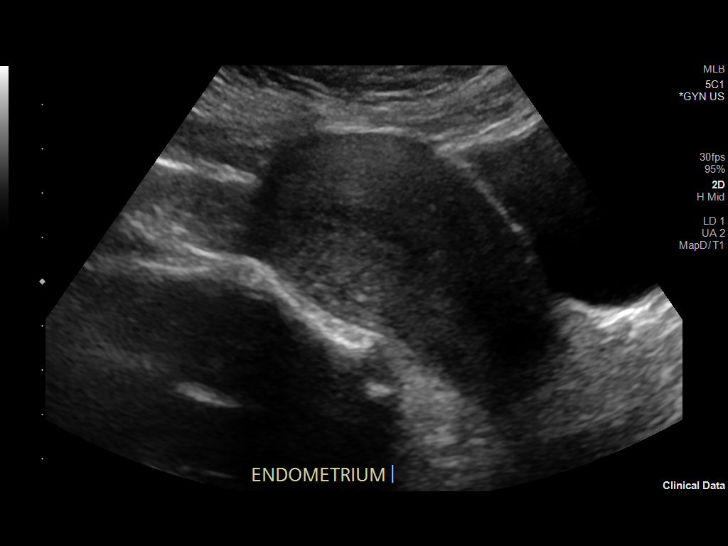
[im 11/42]
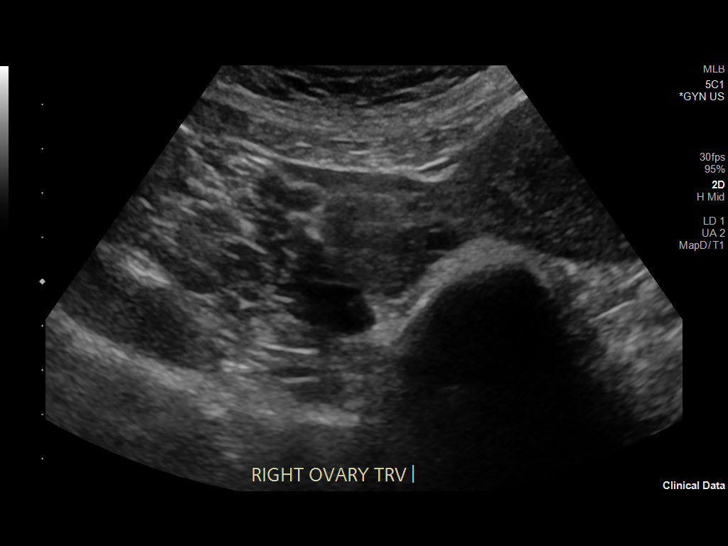
[im 14/42]
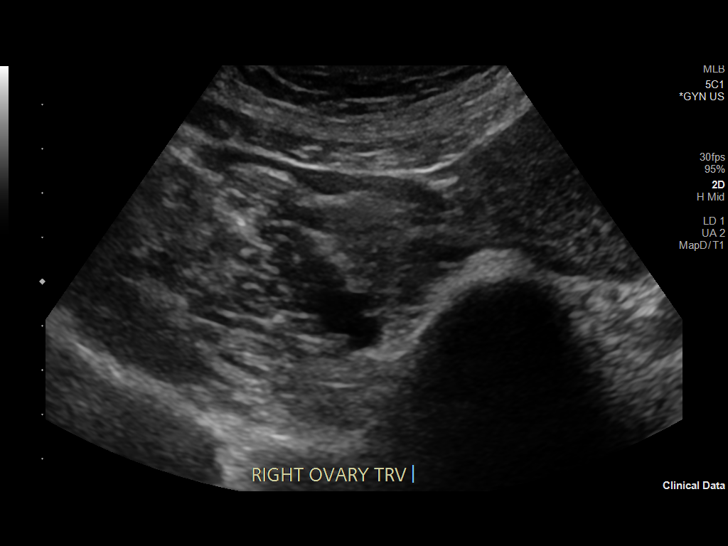
[im 18/42]
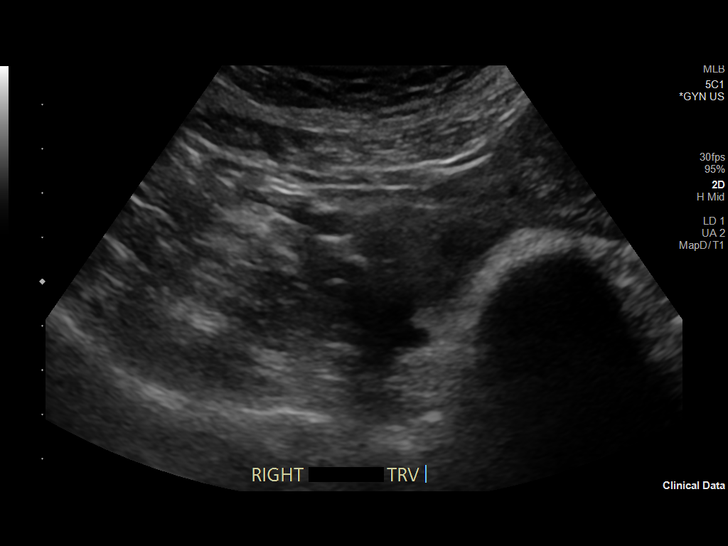
[im 21/42]
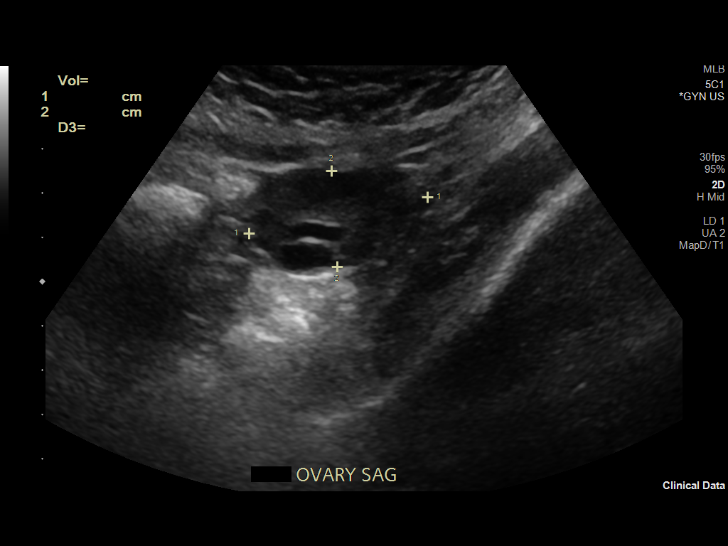
[im 24/42]
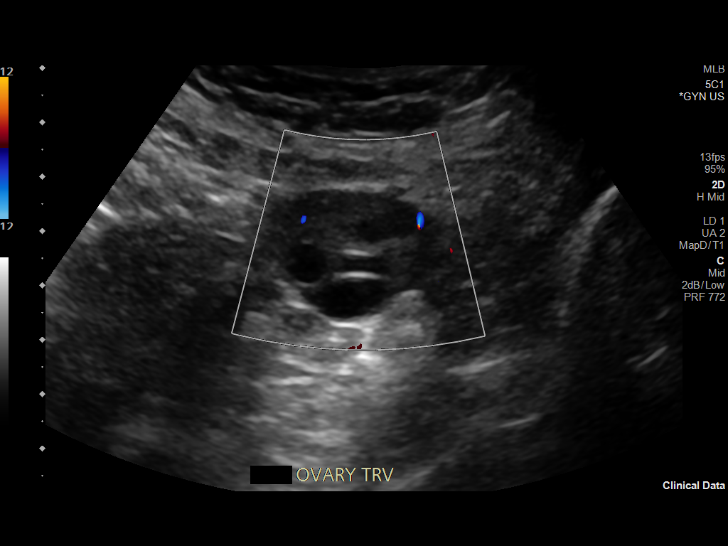
[im 28/42]
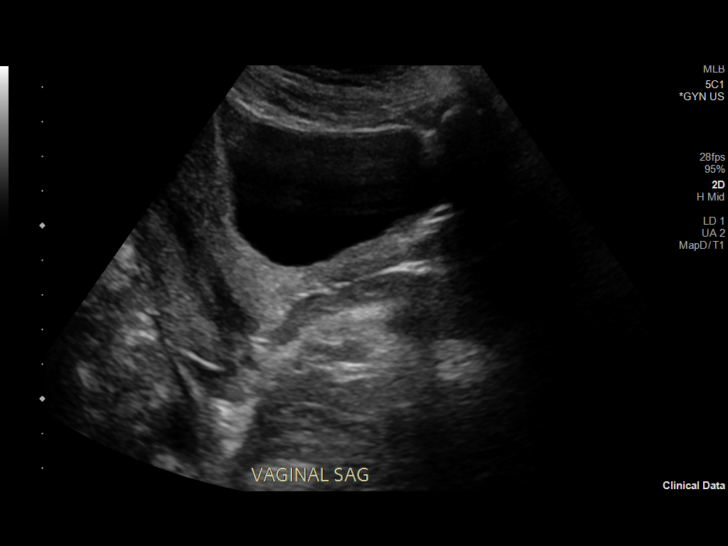
[im 31/42]
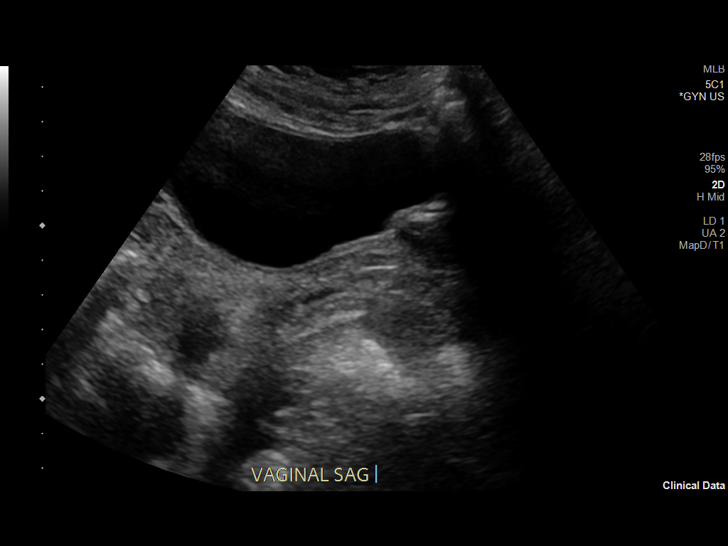
[im 35/42]
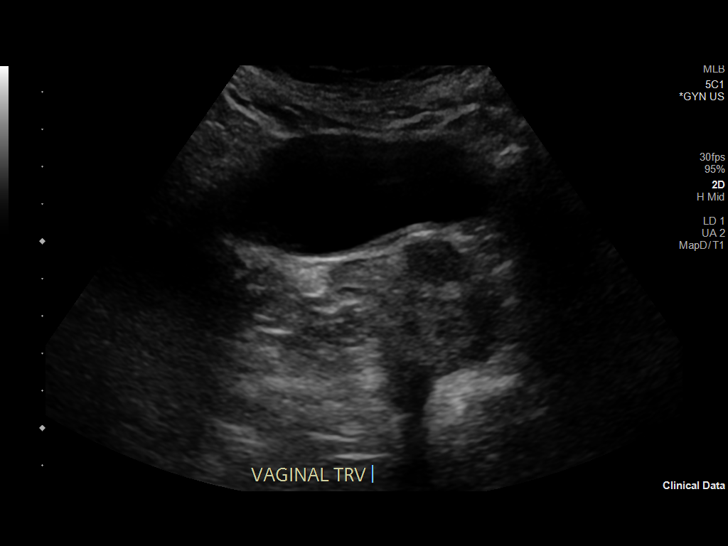
[im 38/42]
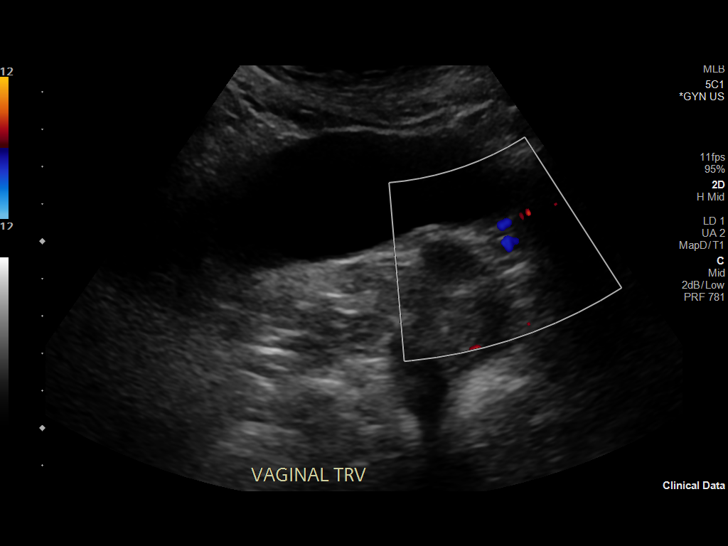
[im 42/42]
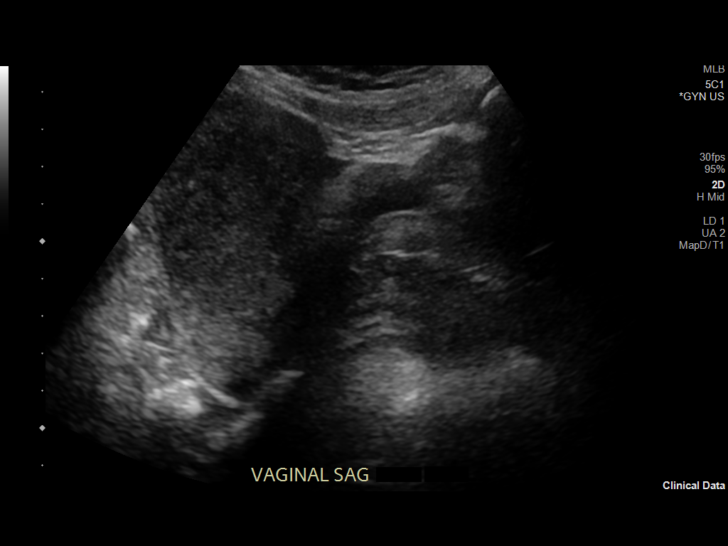

[13 of 25 positions shown; findings below may reference images not displayed]

FINDINGS: Uterus

Measurements: 11.6 x 4.3 x 5.1 cm = volume: 133.2 mL. No fibroids or
other mass visualized.

Endometrium

Thickness: 5.6 mm.  No focal abnormality visualized.

Right ovary

Measurements: 4.4 x 1.9 x 2.4 cm = volume: 10.2 mL. Normal
appearance/no adnexal mass.

Left ovary

Measurements: 4.1 x 2.2 x 3.4 cm = volume: 16.0 mL. Normal
appearance/no adnexal mass.

Other findings

No abnormal free fluid.

No discrete Bartholin's or vaginal inclusion cyst identified. A
questionable cystic lesion seen at the proximal aspect of the vagina
was noted by the sonographer, which appears to communicate with the
bladder proximally, and likely reflects the dilated proximal ureter.
This does not persist and is not seen status post voiding.
IMPRESSION: 1. No sonographic evidence for recurrent Bartholin's or vaginal
inclusion cyst.
2. Otherwise unremarkable and normal pelvic ultrasound.

## 2020-09-15 ENCOUNTER — Encounter (HOSPITAL_COMMUNITY): Payer: Self-pay

## 2020-09-15 ENCOUNTER — Ambulatory Visit (HOSPITAL_COMMUNITY)
Admission: EM | Admit: 2020-09-15 | Discharge: 2020-09-15 | Disposition: A | Discharge status: Home / Self Care | Payer: HRSA Program | Attending: Emergency Medicine | Admitting: Emergency Medicine

## 2020-09-15 ENCOUNTER — Other Ambulatory Visit (HOSPITAL_COMMUNITY): Payer: Self-pay | Admitting: Emergency Medicine

## 2020-09-15 ENCOUNTER — Other Ambulatory Visit: Payer: Self-pay

## 2020-09-15 DIAGNOSIS — Z20822 Contact with and (suspected) exposure to covid-19: Secondary | ICD-10-CM | POA: Diagnosis present

## 2020-09-15 DIAGNOSIS — B349 Viral infection, unspecified: Secondary | ICD-10-CM | POA: Insufficient documentation

## 2020-09-15 MED ORDER — IBUPROFEN 600 MG PO TABS: 600.0000 mg | ORAL_TABLET | Freq: Four times a day (QID) | ORAL | 0 refills | Status: DC | PRN

## 2020-09-15 MED ORDER — BENZONATATE 200 MG PO CAPS: 200.0000 mg | ORAL_CAPSULE | Freq: Three times a day (TID) | ORAL | 0 refills | Status: DC | PRN

## 2020-09-15 MED ORDER — FLUTICASONE PROPIONATE 50 MCG/ACT NA SUSP: 2.0000 | Freq: Every day | NASAL | 0 refills | Status: DC

## 2020-09-15 NOTE — Discharge Instructions (Addendum)
Take 600 mg of ibuprofen combined with 1000 mg of Tylenol together 3-4 times a day as needed for body aches, headaches.  Flonase, saline nasal irrigation with a Lloyd Huger med sinus rinse and distilled water as often as you want for the nasal congestion.  Tessalon for the cough.

## 2020-09-15 NOTE — ED Triage Notes (Signed)
Pt presents with complaints of sore throat, headache, and generalized body aches that started last night. Reports her daughters woke up feeling ill. They are being tested for covid as well.

## 2020-09-15 NOTE — ED Provider Notes (Signed)
HPI  SUBJECTIVE:  Linda Duncan is a 34 y.o. female who presents with sore throat, body, headaches, nasal congestion, postnasal drip, nonproductive cough starting today.  No fevers, sinus pain or pressure, loss of sense of smell or taste, shortness of breath, nausea, vomiting, diarrhea, abdominal pain.  No known Covid exposure.  She did not get the Covid vaccine.  She states that her 3 daughters have identical symptoms starting today.  They were all tested for Covid, the results are not yet back.  No antibiotics in the past month.  She took ibuprofen 600 mg over the past 6 hours.  Ibuprofen did not help.  No aggravating factors.  Past medical history negative for pulmonary disease, diabetes, coronary disease, chronic kidney disease, HIV, cancer, immunocompromise.  LMP: Regular.  Has a Nexplanon.  Denies possibility of being pregnant.  PMD: [facility].  All history obtained through language line  Past Medical History:  Diagnosis Date  . Medical history non-contributory   . Varicose vein of leg     Past Surgical History:  Procedure Laterality Date  . CHOLECYSTECTOMY    . URETHRAL CYST REMOVAL N/A 08/17/2014   Procedure: RESECTION OF SUB- URETHRAL CYST;  Surgeon: Willodean Rosenthal, MD;  Location: [facility];  Service: Gynecology;  Laterality: N/A;  . VARICOSE VEIN SURGERY      Family History  Problem Relation Age of Onset  . Healthy Mother   . Healthy Father   . Hypertension Neg Hx   . Diabetes Neg Hx     Social History   Tobacco Use  . Smoking status: Never Smoker  . Smokeless tobacco: Never Used  Vaping Use  . Vaping Use: Never used  Substance Use Topics  . Alcohol use: No  . Drug use: No    No current facility-administered medications for this encounter.  Current Outpatient Medications:  .  benzonatate (TESSALON) 200 MG capsule, Take 1 capsule (200 mg total) by mouth 3 (three) times daily as needed for cough., Disp: 30 capsule, Rfl: 0 .  etonogestrel  (NEXPLANON) 68 MG IMPL implant, 1 each by Subdermal route once. In left arm, Disp: , Rfl:  .  fluticasone (FLONASE) 50 MCG/ACT nasal spray, Place 2 sprays into both nostrils daily., Disp: 16 g, Rfl: 0 .  ibuprofen (ADVIL) 600 MG tablet, Take 1 tablet (600 mg total) by mouth every 6 (six) hours as needed., Disp: 30 tablet, Rfl: 0  No Known Allergies   ROS  As noted in HPI.   Physical Exam  BP 111/87   Pulse 88   Temp 99.7 F (37.6 C)   Resp 19   SpO2 100%   Constitutional: Well developed, well nourished, no acute distress Eyes:  EOMI, conjunctiva normal bilaterally HENT: Normocephalic, atraumatic,mucus membranes moist.  Mild frontal sinus tenderness.  No maxillary sinus tenderness.  Erythematous, swollen turbinates.  Clear nasal congestion.  Erythematous oropharynx.  Tonsils normal size without exudates.  Uvula midline. Neck: No cervical lymphadenopathy Respiratory: Normal inspiratory effort, lungs clear bilaterally Cardiovascular: Normal rate regular rhythm no murmurs rubs or gallop GI: nondistended skin: No rash, skin intact Musculoskeletal: no deformities Neurologic: Alert & oriented x 3, no focal neuro deficits Psychiatric: Speech and behavior appropriate   ED Course   Medications - No data to display  Orders Placed This Encounter  Procedures  . SARS CORONAVIRUS 2 (TAT 6-24 HRS) Nasopharyngeal Nasopharyngeal Swab    Standing Status:   Standing    Number of Occurrences:   1  Order Specific Question:   Is this test for diagnosis or screening    Answer:   Screening    Order Specific Question:   Symptomatic for COVID-19 as defined by CDC    Answer:   No    Order Specific Question:   Hospitalized for COVID-19    Answer:   No    Order Specific Question:   Admitted to ICU for COVID-19    Answer:   No    Order Specific Question:   Previously tested for COVID-19    Answer:   Yes    Order Specific Question:   Resident in a congregate (group) care setting    Answer:    No    Order Specific Question:   Employed in healthcare setting    Answer:   No    Order Specific Question:   Pregnant    Answer:   No    Order Specific Question:   Has patient completed COVID vaccination(s) (2 doses of Pfizer/Moderna 1 dose of [employer])    Answer:   Unknown    No results found for this or any previous visit (from the past 24 hour(s)). No results found.  ED Clinical Impression  1. Viral illness   2. Encounter for laboratory testing for COVID-19 virus      ED Assessment/Plan  Covid PCR sent.   Supportive treatment with Tylenol/ibuprofen, Flonase, saline nasal irrigation, Tessalon, work note for 2 days.  May return to work if Covid is negative.  Advised patient to get a pulse oximeter if her Covid is positive and to monitor her oxygen levels.  To the ER if he goes below 90%.  Follow-up with PMD as needed.  ER return precautions given.  Using the language line, discussed labs,  MDM, treatment plan, and plan for follow-up with patient. Discussed sn/sx that should prompt return to the ED. patient agrees with plan.   Meds ordered this encounter  Medications  . fluticasone (FLONASE) 50 MCG/ACT nasal spray    Sig: Place 2 sprays into both nostrils daily.    Dispense:  16 g    Refill:  0  . ibuprofen (ADVIL) 600 MG tablet    Sig: Take 1 tablet (600 mg total) by mouth every 6 (six) hours as needed.    Dispense:  30 tablet    Refill:  0  . benzonatate (TESSALON) 200 MG capsule    Sig: Take 1 capsule (200 mg total) by mouth 3 (three) times daily as needed for cough.    Dispense:  30 capsule    Refill:  0    *This clinic note was created using Scientist, clinical (histocompatibility and immunogenetics). Therefore, there may be occasional mistakes despite careful proofreading.   ?    Domenick Gong, MD 09/15/20 2039

## 2020-09-16 ENCOUNTER — Other Ambulatory Visit: Payer: Self-pay | Admitting: Physician Assistant

## 2020-09-16 DIAGNOSIS — U071 COVID-19: Secondary | ICD-10-CM

## 2020-09-16 LAB — SARS CORONAVIRUS 2 (TAT 6-24 HRS): SARS Coronavirus 2: POSITIVE — AB

## 2020-09-16 MED FILL — BENZONATATE 100 MG CAPS: 100 | 10 days supply | Qty: 60 | Fill #0

## 2020-09-16 MED FILL — IBUPROFEN 600 MG TABLET: 600 | 7 days supply | Qty: 30 | Fill #0

## 2020-09-16 NOTE — Progress Notes (Signed)
I connected by phone with Orest Dikes on 09/16/2020 at 4:43 PM to discuss the potential use of a new treatment for mild to moderate COVID-19 viral infection in non-hospitalized patients.  This patient is a 34 y.o. female that meets the FDA criteria for Emergency Use Authorization of COVID monoclonal antibody casirivimab/imdevimab.  Has a (+) direct SARS-CoV-2 viral test result  Has mild or moderate COVID-19   Is NOT hospitalized due to COVID-19  Is within 10 days of symptom onset  Has at least one of the high risk factor(s) for progression to severe COVID-19 and/or hospitalization as defined in EUA.  Specific high risk criteria : Other high risk medical condition per CDC:  socially vulnerable population    I have spoken and communicated the following to the patient or parent/caregiver regarding COVID monoclonal antibody treatment:  1. FDA has authorized the emergency use for the treatment of mild to moderate COVID-19 in adults and pediatric patients with positive results of direct SARS-CoV-2 viral testing who are 50 years of age and older weighing at least 40 kg, and who are at high risk for progressing to severe COVID-19 and/or hospitalization.  2. The significant known and potential risks and benefits of COVID monoclonal antibody, and the extent to which such potential risks and benefits are unknown.  3. Information on available alternative treatments and the risks and benefits of those alternatives, including clinical trials.  4. Patients treated with COVID monoclonal antibody should continue to self-isolate and use infection control measures (e.g., wear mask, isolate, social distance, avoid sharing personal items, clean and disinfect "high touch" surfaces, and frequent handwashing) according to CDC guidelines.   5. The patient or parent/caregiver has the option to accept or refuse COVID monoclonal antibody treatment.  After reviewing this information with the patient, the  patient has agreed to receive one of the available covid 19 monoclonal antibodies and will be provided an appropriate fact sheet prior to infusion.  Sx onset 9/29. Set up for infusion on 10/2 @ 12:30pm. Directions given to [facility]. Pt is aware that insurance will be charged an infusion fee. Pt is unvaccinated.   Cline Crock 09/16/2020 4:43 PM

## 2020-09-17 ENCOUNTER — Ambulatory Visit (HOSPITAL_COMMUNITY)
Admission: RE | Admit: 2020-09-17 | Discharge: 2020-09-17 | Disposition: A | Discharge status: Home / Self Care | Payer: HRSA Program | Source: Ambulatory Visit | Attending: Pulmonary Disease | Admitting: Pulmonary Disease

## 2020-09-17 DIAGNOSIS — U071 COVID-19: Secondary | ICD-10-CM | POA: Diagnosis present

## 2020-09-17 MED ORDER — SODIUM CHLORIDE 0.9 % IV SOLN: INTRAVENOUS | Status: DC | PRN

## 2020-09-17 MED ORDER — SODIUM CHLORIDE 0.9 % IV SOLN
1200.0000 mg | Freq: Once | INTRAVENOUS | Status: AC
  Administered 2020-09-17: 1200 mg via INTRAVENOUS

## 2020-09-17 MED ORDER — EPINEPHRINE 0.3 MG/0.3ML IJ SOAJ: 0.3000 mg | Freq: Once | INTRAMUSCULAR | Status: DC | PRN

## 2020-09-17 MED ORDER — FAMOTIDINE IN NACL 20-0.9 MG/50ML-% IV SOLN: 20.0000 mg | Freq: Once | INTRAVENOUS | Status: DC | PRN

## 2020-09-17 MED ORDER — METHYLPREDNISOLONE SODIUM SUCC 125 MG IJ SOLR: 125.0000 mg | Freq: Once | INTRAMUSCULAR | Status: DC | PRN

## 2020-09-17 MED ORDER — ALBUTEROL SULFATE HFA 108 (90 BASE) MCG/ACT IN AERS: 2.0000 | INHALATION_SPRAY | Freq: Once | RESPIRATORY_TRACT | Status: DC | PRN

## 2020-09-17 MED ORDER — DIPHENHYDRAMINE HCL 50 MG/ML IJ SOLN: 50.0000 mg | Freq: Once | INTRAMUSCULAR | Status: DC | PRN

## 2020-09-17 NOTE — Progress Notes (Signed)
  Diagnosis: COVID-19  Physician: Dr Wright  Procedure: Covid Infusion Clinic Med: casirivimab\imdevimab infusion - Provided patient with casirivimab\imdevimab fact sheet for patients, parents and caregivers prior to infusion.  Complications: No immediate complications noted.  Discharge: Discharged home   Linda Duncan 09/17/2020   

## 2020-09-17 NOTE — Discharge Instructions (Signed)

## 2021-12-04 ENCOUNTER — Ambulatory Visit: Payer: Self-pay | Admitting: *Deleted

## 2021-12-04 NOTE — Telephone Encounter (Signed)
Summary: infected finger   Pt states 6-7 months ago she cut her finger at work. The cut has closed, but infection is in the finger and it hurts. Pt would like appt to be seen for the cut this week.  However, no appts I can schedule.  N/A at the office.      Reason for Disposition  Severe pain in the wound  Answer Assessment - Initial Assessment Questions 1. LOCATION: "Where is the wound located?"          Thumb- right 2. WOUND APPEARANCE: "What does the wound look like?"      Below the nail 3. SIZE: If redness is present, ask: "What is the size of the red area?" (Inches, centimeters, or compare to size of a coin)      larger 4. SPREAD: "What's changed in the last day?"  "Do you see any red streaks coming from the wound?"     Red/purple in color 5. ONSET: "When did it start to look infected?"      6-7 months ago- infection started 2 weeks 6. MECHANISM: "How did the wound start, what was the cause?"     Cut thumb at work 7. PAIN: "Is there any pain?" If Yes, ask: "How bad is the pain?"   (Scale 1-10; or mild, moderate, severe)     severe 8. FEVER: "Do you have a fever?" If Yes, ask: "What is your temperature, how was it measured, and when did it start?"     no 9. OTHER SYMPTOMS: "Do you have any other symptoms?" (e.g., shaking chills, weakness, rash elsewhere on body)     no 10. PREGNANCY: "Is there any chance you are pregnant?" "When was your last menstrual period?"  Protocols used: Wound Infection-A-AH

## 2021-12-04 NOTE — Telephone Encounter (Signed)
°  Chief Complaint: wound infection Symptoms: cut on thumb- pain, draining pus, discoloration  Frequency: 6-7 months Pertinent Negatives: Patient denies  Disposition: [x] ED /[] Urgent Care (no appt availability in office) / [] Appointment(In office/virtual)/ []  [facility]/ [] Home Care/ [] Refused Recommended Disposition  Additional Notes: Advised ED- patient declines- but will go to UC for evaluation of thumb wound/infection

## 2022-01-19 ENCOUNTER — Other Ambulatory Visit: Payer: Self-pay

## 2022-01-19 ENCOUNTER — Ambulatory Visit: Payer: Self-pay | Attending: Internal Medicine | Admitting: Nurse Practitioner

## 2022-01-19 ENCOUNTER — Encounter: Payer: Self-pay | Admitting: Nurse Practitioner

## 2022-01-19 VITALS — BP 101/68 | HR 75 | Ht 59.0 in | Wt 125.4 lb

## 2022-01-19 DIAGNOSIS — Z114 Encounter for screening for human immunodeficiency virus [HIV]: Secondary | ICD-10-CM

## 2022-01-19 DIAGNOSIS — Z13220 Encounter for screening for lipoid disorders: Secondary | ICD-10-CM

## 2022-01-19 DIAGNOSIS — Z13228 Encounter for screening for other metabolic disorders: Secondary | ICD-10-CM

## 2022-01-19 DIAGNOSIS — Z1159 Encounter for screening for other viral diseases: Secondary | ICD-10-CM

## 2022-01-19 DIAGNOSIS — Z130 Encounter for screening for diseases of the blood and blood-forming organs and certain disorders involving the immune mechanism: Secondary | ICD-10-CM

## 2022-01-19 DIAGNOSIS — S6010XD Contusion of unspecified finger with damage to nail, subsequent encounter: Secondary | ICD-10-CM

## 2022-01-19 MED ORDER — MUPIROCIN 2 % EX OINT
1.0000 "application " | TOPICAL_OINTMENT | Freq: Two times a day (BID) | CUTANEOUS | 0 refills | Status: AC
  Filled 2022-01-19: qty 44, 14d supply, fill #0

## 2022-01-19 MED ORDER — DOXYCYCLINE HYCLATE 100 MG PO TABS
100.0000 mg | ORAL_TABLET | Freq: Two times a day (BID) | ORAL | 0 refills | Status: AC
  Filled 2022-01-19: qty 10, 5d supply, fill #0

## 2022-01-19 NOTE — Progress Notes (Signed)
Assessment & Plan:  Linda Duncan was seen today for hand pain.  Diagnoses and all orders for this visit:  Subungual hematoma of finger of right hand, subsequent encounter -     mupirocin ointment (BACTROBAN) 2 %; Apply 1 application topically 2 (two) times daily for 14 days. -     doxycycline (VIBRA-TABS) 100 MG tablet; Take 1 tablet (100 mg total) by mouth 2 (two) times daily for 5 days. -     Ambulatory referral to Hand Surgery  Screening for metabolic disorder -     [id number]  Screening for deficiency anemia -     CBC  Lipid screening -     Lipid panel  Encounter for screening for HIV -     HIV antibody (with reflex)  Need for hepatitis C screening test -     HCV Ab w Reflex to Quant PCR    Patient has been counseled on age-appropriate routine health concerns for screening and prevention. These are reviewed and up-to-date. Referrals have been placed accordingly. Immunizations are up-to-date or declined.    Subjective:   Chief Complaint  Patient presents with   Hand Pain   HPI Linda Duncan 37 y.o. female presents to office today for evaluation of right thumb subungual hematoma.    SKIN States she cut her thumb with a knife several months ago. She did not seek medical attention at that time. Since then she states she has been experiencing intermittent purulent drainage from the nail bed along with swelling, pain and tenderness. She is up to date on her tetanus.     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  Her daughter is translating for her today.    Review of Systems  Constitutional:  Negative for fever, malaise/fatigue and weight loss.  HENT: Negative.  Negative for nosebleeds.   Eyes: Negative.  Negative for blurred vision, double vision and photophobia.  Respiratory: Negative.  Negative for cough and shortness of breath.   Cardiovascular: Negative.  Negative for chest pain, palpitations and leg swelling.   Gastrointestinal: Negative.  Negative for heartburn, nausea and vomiting.  Musculoskeletal: Negative.  Negative for myalgias.  Skin:        SEE HPI  Neurological: Negative.  Negative for dizziness, focal weakness, seizures and headaches.  Psychiatric/Behavioral: Negative.  Negative for suicidal ideas.    Past Medical History:  Diagnosis Date   Medical history non-contributory    Varicose vein of leg     Past Surgical History:  Procedure Laterality Date   CHOLECYSTECTOMY     URETHRAL CYST REMOVAL N/A 08/17/2014   Procedure: RESECTION OF SUB- URETHRAL CYST;  Surgeon: Lavonia Drafts, MD;  Location: [facility];  Service: Gynecology;  Laterality: N/A;   VARICOSE VEIN SURGERY      Family History  Problem Relation Age of Onset   Healthy Mother    Healthy Father    Hypertension Neg Hx    Diabetes Neg Hx     Social History Reviewed with no changes to be made today.   Outpatient Medications Prior to Visit  Medication Sig Dispense Refill   benzonatate (TESSALON) 200 MG capsule Take 1 capsule (200 mg total) by mouth 3 (three) times daily as needed for cough. (Patient not taking: Reported on 01/19/2022) 30 capsule 0   etonogestrel (NEXPLANON) 68 MG IMPL implant 1 each by Subdermal route once. In left arm (Patient not taking: Reported on 01/19/2022)     fluticasone (  FLONASE) 50 MCG/ACT nasal spray Place 2 sprays into both nostrils daily. (Patient not taking: Reported on 01/19/2022) 16 g 0   No facility-administered medications prior to visit.    No Known Allergies     Objective:    BP 101/68    Pulse 75    Ht $R'4\' 11"'Uo$  (1.499 m)    Wt 125 lb 6 oz (56.9 kg)    LMP 01/11/2022    SpO2 100%    BMI 25.32 kg/m  Wt Readings from Last 3 Encounters:  01/19/22 125 lb 6 oz (56.9 kg)  10/27/18 113 lb (51.3 kg)  10/02/18 113 lb 6.4 oz (51.4 kg)    Physical Exam Vitals and nursing note reviewed.  Constitutional:      Appearance: She is well-developed.  HENT:     Head: Normocephalic and  atraumatic.  Cardiovascular:     Rate and Rhythm: Normal rate and regular rhythm.     Heart sounds: Normal heart sounds. No murmur heard.   No friction rub. No gallop.  Pulmonary:     Effort: Pulmonary effort is normal. No tachypnea or respiratory distress.     Breath sounds: Normal breath sounds. No decreased breath sounds, wheezing, rhonchi or rales.  Chest:     Chest wall: No tenderness.  Abdominal:     General: Bowel sounds are normal.     Palpations: Abdomen is soft.  Musculoskeletal:        General: Normal range of motion.     Cervical back: Normal range of motion.  Skin:    General: Skin is warm and dry.     Comments: SEE PHOTOS  Neurological:     Mental Status: She is alert and oriented to person, place, and time.     Coordination: Coordination normal.  Psychiatric:        Behavior: Behavior normal. Behavior is cooperative.        Thought Content: Thought content normal.        Judgment: Judgment normal.         Patient has been counseled extensively about nutrition and exercise as well as the importance of adherence with medications and regular follow-up. The patient was given clear instructions to go to ER or return to medical center if symptoms don't improve, worsen or new problems develop. The patient verbalized understanding.   Follow-up: Return for PAP SMEAR.   Gildardo Pounds, FNP-BC [facility] and [place], [place]   01/19/2022, 7:53 PM

## 2022-01-20 LAB — CMP14+EGFR
ALT: 12 IU/L (ref 0–32)
AST: 20 IU/L (ref 0–40)
Albumin/Globulin Ratio: 1.5 (ref 1.2–2.2)
Albumin: 4.4 g/dL (ref 3.8–4.8)
Alkaline Phosphatase: 55 IU/L (ref 44–121)
BUN/Creatinine Ratio: 19 (ref 9–23)
BUN: 11 mg/dL (ref 6–20)
Bilirubin Total: 0.3 mg/dL (ref 0.0–1.2)
CO2: 22 mmol/L (ref 20–29)
Calcium: 8.9 mg/dL (ref 8.7–10.2)
Chloride: 107 mmol/L — ABNORMAL HIGH (ref 96–106)
Creatinine, Ser: 0.57 mg/dL (ref 0.57–1.00)
Globulin, Total: 3 g/dL (ref 1.5–4.5)
Glucose: 48 mg/dL — ABNORMAL LOW (ref 70–99)
Potassium: 4.3 mmol/L (ref 3.5–5.2)
Sodium: 144 mmol/L (ref 134–144)
Total Protein: 7.4 g/dL (ref 6.0–8.5)
eGFR: 121 mL/min/1.73

## 2022-01-20 LAB — LIPID PANEL
Chol/HDL Ratio: 1.7 ratio (ref 0.0–4.4)
Cholesterol, Total: 195 mg/dL (ref 100–199)
HDL: 112 mg/dL (ref >39)
LDL Chol Calc (NIH): 69 mg/dL (ref 0–99)
Triglycerides: 79 mg/dL (ref 0–149)
VLDL Cholesterol Cal: 14 mg/dL (ref 5–40)

## 2022-01-20 LAB — HCV AB W REFLEX TO QUANT PCR: HCV Ab: <0.1 {s_co_ratio} (ref 0.0–0.9)

## 2022-01-20 LAB — CBC
Hematocrit: 43.7 % (ref 34.0–46.6)
Hemoglobin: 14.3 g/dL (ref 11.1–15.9)
MCH: 29.1 pg (ref 26.6–33.0)
MCHC: 32.7 g/dL (ref 31.5–35.7)
MCV: 89 fL (ref 79–97)
Platelets: 276 x10E3/uL (ref 150–450)
RBC: 4.91 x10E6/uL (ref 3.77–5.28)
RDW: 13.1 % (ref 11.7–15.4)
WBC: 6.5 x10E3/uL (ref 3.4–10.8)

## 2022-01-20 LAB — HIV ANTIBODY (ROUTINE TESTING W REFLEX): HIV Screen 4th Generation wRfx: NONREACTIVE

## 2022-01-20 LAB — HCV INTERPRETATION

## 2022-01-22 ENCOUNTER — Telehealth: Payer: Self-pay | Admitting: Nurse Practitioner

## 2022-01-22 ENCOUNTER — Telehealth: Payer: Self-pay | Admitting: *Deleted

## 2022-01-22 ENCOUNTER — Other Ambulatory Visit: Payer: Self-pay

## 2022-01-22 ENCOUNTER — Telehealth: Payer: Self-pay

## 2022-01-22 NOTE — Telephone Encounter (Signed)
Pt returned call, lab results discussed via interpreter [phone]. Verbalized understanding.

## 2022-01-22 NOTE — Telephone Encounter (Signed)
Left message to return call to our office.  °With the help of interpretor Luana  #[phone] °

## 2022-01-22 NOTE — Telephone Encounter (Signed)
-----   Message from Claiborne Rigg, NP sent at 01/21/2022  8:51 AM EST ----- Kidney, liver function and electrolytes are normal.    CBC does not indicate any anemia or bleeding disorders   Normal cholesterol levels  HIV negative/HEP C negative

## 2022-01-25 ENCOUNTER — Ambulatory Visit: Payer: Self-pay | Attending: Nurse Practitioner

## 2022-01-26 ENCOUNTER — Encounter: Payer: Self-pay | Admitting: Orthopedic Surgery

## 2022-01-26 ENCOUNTER — Ambulatory Visit (INDEPENDENT_AMBULATORY_CARE_PROVIDER_SITE_OTHER): Payer: Self-pay | Admitting: Orthopedic Surgery

## 2022-01-26 ENCOUNTER — Other Ambulatory Visit: Payer: Self-pay

## 2022-01-26 DIAGNOSIS — L03011 Cellulitis of right finger: Secondary | ICD-10-CM

## 2022-01-26 NOTE — Progress Notes (Signed)
Office Visit Note   Patient: Linda Duncan           Date of Birth: [birth year]           MRN: [id number] Visit Date: 01/26/2022              Requested by: Claiborne Rigg, NP [address] [place],  [place] [address] PCP: Claiborne Rigg, NP   Assessment & Plan: Visit Diagnoses:  1. Chronic paronychia of right thumb     Plan: We discussed the diagnosis, prognosis, non-operative and operative treatment options for chronic paronychia.  After our discussion, the patient would like to proceed with topical gentian violet.  We reviewed the risks and benefits of conservative management.  The patient expressed understanding of the reasoning and strategy going forward.  All patient questions and concerns were addressed.    Follow-Up Instructions: No follow-ups on file.   Orders:  No orders of the defined types were placed in this encounter.  No orders of the defined types were placed in this encounter.     Procedures: No procedures performed   Clinical Data: No additional findings.   Subjective: Chief Complaint  Patient presents with   Right Hand - Pain    This is a 36 year old right-hand-dominant female who works at [employer] and presents with pain, swelling, and nail changes involving her right thumb.  She initially cut her thumb 2 months ago while cutting chicken at work.  She since developed the symptoms.  She has not had any treatment.  She was seen by another provider and put on antibiotics for 4 days.  The area around the nail will occasionally drain.  She denies any involvement of other fingers.   Review of Systems   Objective: Vital Signs: LMP 01/11/2022   Physical Exam Constitutional:      Appearance: Normal appearance.  Cardiovascular:     Rate and Rhythm: Normal rate.     Pulses: Normal pulses.  Pulmonary:     Effort: Pulmonary effort is normal.  Skin:    General: Skin is warm and dry.     Capillary Refill: Capillary refill takes less  than 2 seconds.  Neurological:     Mental Status: She is alert.    Right Hand Exam   Tenderness  Right hand tenderness location: Mildly TTP around thumb proximal nail fold.  Other  Sensation: normal Pulse: present  Comments:  Deformity of nail plate with ridges and folds.  Mild lifting up of proximal nail fold with associated swelling.  No drainage.  No pain in remainder of thumb.      Specialty Comments:  No specialty comments available.  Imaging: No results found.   PMFS History: Patient Active Problem List   Diagnosis Date Noted   Chronic paronychia of right thumb 01/26/2022   Menorrhagia with irregular cycle 08/28/2016   Chronic venous insufficiency 05/25/2016   Varicose veins of right lower extremity with complications 02/24/2016   Dyspareunia 03/25/2014   Past Medical History:  Diagnosis Date   Medical history non-contributory    Varicose vein of leg     Family History  Problem Relation Age of Onset   Healthy Mother    Healthy Father    Hypertension Neg Hx    Diabetes Neg Hx     Past Surgical History:  Procedure Laterality Date   CHOLECYSTECTOMY     URETHRAL CYST REMOVAL N/A 08/17/2014   Procedure: RESECTION OF SUB- URETHRAL CYST;  Surgeon: Willodean Rosenthal, MD;  Location: [facility];  Service: Gynecology;  Laterality: N/A;   VARICOSE VEIN SURGERY     Social History   Occupational History   Not on file  Tobacco Use   Smoking status: Never   Smokeless tobacco: Never  Vaping Use   Vaping Use: Never used  Substance and Sexual Activity   Alcohol use: No   Drug use: No   Sexual activity: Yes    Birth control/protection: Implant

## 2022-01-29 ENCOUNTER — Other Ambulatory Visit: Payer: Self-pay

## 2022-02-02 ENCOUNTER — Other Ambulatory Visit: Payer: Self-pay

## 2022-02-02 ENCOUNTER — Ambulatory Visit (INDEPENDENT_AMBULATORY_CARE_PROVIDER_SITE_OTHER): Payer: Self-pay

## 2022-02-02 ENCOUNTER — Ambulatory Visit (INDEPENDENT_AMBULATORY_CARE_PROVIDER_SITE_OTHER): Payer: Self-pay | Admitting: Orthopedic Surgery

## 2022-02-02 DIAGNOSIS — L03011 Cellulitis of right finger: Secondary | ICD-10-CM

## 2022-02-02 NOTE — Progress Notes (Signed)
Office Visit Note   Patient: Linda Duncan           Date of Birth: 03-Jul-1986           MRN: [id number] Visit Date: 02/02/2022              Requested by: Claiborne Rigg, NP [address] [place],  [place] [address] PCP: Claiborne Rigg, NP   Assessment & Plan: Visit Diagnoses:  1. Chronic paronychia of right thumb     Plan: Patient says her finger has unchanged since her previous visit.  We previously discussed using gentian violet topically twice a day in the thumb around the eponychial fold.  There is no evidence of purple discoloration of her finger as would be expected with this treatment.  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  She says this washed off the dye.  We discussed that regardless of our treatment methods, she will have to keep her hand as clean and dry as possible.  If nonoperative treatment fails and we discuss marsupialization or other treatment, she will have to keep this hand clean and out of dirty dishwater.  This seems like it will be quite difficult for her as she is unable to take any time off of work and she is unable to get any alterations in her work duties.  We discussed her allergies to try to keep her hand dry.  This includes multiple gloves, use of rubber bands, the use of Coban, and use of gloves that go up above her elbow.  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  I will see her back in another week and see if she has made any progress with nonoperative management.  Follow-Up Instructions: No follow-ups on file.   Orders:  Orders Placed This Encounter  Procedures   XR Finger Thumb Right   No orders of the defined types were placed in this encounter.     Procedures: No procedures performed   Clinical Data: No additional findings.   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  This has been going on for nearly 9 months per the patient.  We previously discussed using topical gentian violet which she claims has been using.  She works as a Public affairs consultant at night and keeps this thumb and hand submerged in Science writer for hours at a time.  She says that this washed off the topical dye.  She still has unchanged pain around the proximal nail fold and unchanged appearance of the fingernail.    Review of Systems   Objective: Vital Signs: LMP 01/11/2022   Physical Exam  Right Hand Exam   Tenderness  Right hand tenderness location: TTP around proximal nail fold.  Comments:  Rounded proximal nail fold with slight separation from nail plate.  Mild surrounding eyrthema.  Significant changes to the nail plate with ruffles and ridges.      Specialty Comments:  No specialty comments available.  Imaging: No results found.   PMFS History: Patient Active Problem List   Diagnosis Date Noted   Chronic paronychia of right thumb 01/26/2022   Menorrhagia  with irregular cycle 08/28/2016   Chronic venous insufficiency 05/25/2016   Varicose veins of right lower extremity with complications 02/24/2016   Dyspareunia 03/25/2014   Past Medical History:  Diagnosis Date   Medical history non-contributory    Varicose vein of leg     Family History  Problem Relation Age of Onset   Healthy Mother    Healthy Father    Hypertension Neg Hx    Diabetes Neg Hx     Past Surgical History:  Procedure Laterality Date   CHOLECYSTECTOMY     URETHRAL CYST REMOVAL N/A 08/17/2014   Procedure: RESECTION OF SUB- URETHRAL CYST;  Surgeon: Willodean Rosenthal, MD;  Location: [facility];  Service: Gynecology;  Laterality: N/A;   VARICOSE VEIN SURGERY     Social History   Occupational  History   Not on file  Tobacco Use   Smoking status: Never   Smokeless tobacco: Never  Vaping Use   Vaping Use: Never used  Substance and Sexual Activity   Alcohol use: No   Drug use: No   Sexual activity: Yes    Birth control/protection: Implant

## 2022-02-15 ENCOUNTER — Ambulatory Visit (INDEPENDENT_AMBULATORY_CARE_PROVIDER_SITE_OTHER): Payer: Self-pay | Admitting: Orthopedic Surgery

## 2022-02-15 DIAGNOSIS — L03011 Cellulitis of right finger: Secondary | ICD-10-CM

## 2022-02-15 NOTE — Progress Notes (Signed)
? ?Office Visit Note ?  ?Patient: Linda Duncan           ?Date of Birth: [birth year]           ?MRN: [id number] ?Visit Date: 02/15/2022 ?             ?Requested by: Claiborne Rigg, NP ?[address] ?[place],  [place] [address] ?PCP: Claiborne Rigg, NP ? ? ?Assessment & Plan: ?Visit Diagnoses:  ?1. Chronic paronychia of right thumb   ? ? ?Plan: Patient has been using topical gentian violet and has also been able to get her tasks at work switched around.  She is no longer sticking her hand into dirty dishwater every day.  Her thumb looks much better today.  Her pain is improved.  We will continue the topical treatment for the week and I will see her back in the office. ? ?Follow-Up Instructions: No follow-ups on file.  ? ?Orders:  ?No orders of the defined types were placed in this encounter. ? ?No orders of the defined types were placed in this encounter. ? ? ? ? Procedures: ?No procedures performed ? ? ?Clinical Data: ?No additional findings. ? ? ?Subjective: ?Chief Complaint  ?Patient presents with  ? Right Thumb - Follow-up  ?  She thinks it is better ?  ? ? 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  Is been going on for 9 months.  She was seen last week at which time we discussed using topical gentian violet and avoiding sticking her hand in dirty dishwater.  She has been able to switch her tasks at work and is no longer involved in the dishwashing.  She has been doing the local treatment diligently.  She has noticed an improvement in pain and swelling of the paronychia. ? ? ?Review of Systems ? ? ?Objective: ?Vital Signs: There were no vitals taken for this visit. ? ?Physical Exam ? ?Right Hand Exam  ? ?Tenderness  ?Right hand tenderness location: Minimal TTP around proximal nail fold. ? ?Comments:  Paronychial swelling appears to be improved.  Paronychia dyed from topical gentian  violet treatment.  ? ? ? ? ?Specialty Comments:  ?No specialty comments available. ? ?Imaging: ?No results found. ? ? ?PMFS History: ?Patient Active Problem List  ? Diagnosis Date Noted  ? Chronic paronychia of right thumb 01/26/2022  ? Menorrhagia with irregular cycle 08/28/2016  ? Chronic venous insufficiency 05/25/2016  ? Varicose veins of right lower extremity with complications 02/24/2016  ? Dyspareunia 03/25/2014  ? ?Past Medical History:  ?Diagnosis Date  ? Medical history non-contributory   ? Varicose vein of leg   ?  ?Family History  ?Problem Relation Age of Onset  ? Healthy Mother   ? Healthy Father   ? Hypertension Neg Hx   ? Diabetes Neg Hx   ?  ?Past Surgical History:  ?Procedure Laterality Date  ? CHOLECYSTECTOMY    ? URETHRAL CYST REMOVAL N/A 08/17/2014  ? Procedure: RESECTION OF SUB- URETHRAL CYST;  Surgeon: Willodean Rosenthal, MD;  Location: [facility];  Service: Gynecology;  Laterality: N/A;  ? VARICOSE VEIN SURGERY    ? ?Social History  ? ?Occupational History  ? Not on file  ?Tobacco Use  ? Smoking status: Never  ? Smokeless tobacco: Never  ?Vaping Use  ? Vaping Use: Never used  ?Substance and Sexual Activity  ? Alcohol use: No  ?  Drug use: No  ? Sexual activity: Yes  ?  Birth control/protection: Implant  ? ? ? ? ? ? ?

## 2022-02-23 ENCOUNTER — Ambulatory Visit (INDEPENDENT_AMBULATORY_CARE_PROVIDER_SITE_OTHER): Payer: Self-pay | Admitting: Orthopedic Surgery

## 2022-02-23 ENCOUNTER — Other Ambulatory Visit: Payer: Self-pay

## 2022-02-23 DIAGNOSIS — L03011 Cellulitis of right finger: Secondary | ICD-10-CM

## 2022-02-26 NOTE — Progress Notes (Signed)
? ?Office Visit Note ?  ?Patient: Linda Duncan           ?Date of Birth: 09-22-1986           ?MRN: [id number] ?Visit Date: 02/23/2022 ?             ?Requested by: Claiborne Rigg, NP ?[address] ?[place],  [place] [address] ?PCP: Claiborne Rigg, NP ? ? ?Assessment & Plan: ?Visit Diagnoses:  ?1. Chronic paronychia of right thumb   ? ? ?Plan: Patient has been using the gentian violet treatment for about 2 weeks now.  The pain at the proximal aspect of her nail fold has resolved.  The swelling inflammation around the proximal nail fold is also much improved.  She also has chronic changes of the nail plate which we discussed will hopefully improve after the chronic paronychia is treated.  She continue to use the gentian violet until her symptoms have completely resolved.  She can follow-up with me again as needed. ? ?Follow-Up Instructions: No follow-ups on file.  ? ?Orders:  ?No orders of the defined types were placed in this encounter. ? ?No orders of the defined types were placed in this encounter. ? ? ? ? Procedures: ?No procedures performed ? ? ?Clinical Data: ?No additional findings. ? ? ?Subjective: ?Chief Complaint  ?Patient presents with  ? Right Thumb - Follow-up  ? ? 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  This has been going on for 9 months.  She has been using gentian violet topically around the proximal nail fold with significant symptom improvement.  The pain at the proximal aspect of her nail has almost resolved.  The swelling and inflammation around the proximal nail fold is much improved.  She still has chronic pitting and roughening of the nail plate.  She is overall happy with her progress so far. ? ? ?Review of Systems ? ? ?Objective: ?Vital Signs: There were no vitals taken for this visit. ? ?Physical Exam ? ?Left Hand Exam  ? ?Tenderness  ?The patient is  experiencing no tenderness.  ? ?Other  ?Erythema: absent ?Sensation: normal ?Pulse: present ? ?Comments:  Swelling and tenderness around the proximal nail fold is much improved.  There is no drainage.  She does have purple staining of the soft tissue on the thumb. ? ? ? ? ?Specialty Comments:  ?No specialty comments available. ? ?Imaging: ?No results found. ? ? ?PMFS History: ?Patient Active Problem List  ? Diagnosis Date Noted  ? Chronic paronychia of right thumb 01/26/2022  ? Menorrhagia with irregular cycle 08/28/2016  ? Chronic venous insufficiency 05/25/2016  ? Varicose veins of right lower extremity with complications 02/24/2016  ? Dyspareunia 03/25/2014  ? ?Past Medical History:  ?Diagnosis Date  ? Medical history non-contributory   ? Varicose vein of leg   ?  ?Family History  ?Problem Relation Age of Onset  ? Healthy Mother   ? Healthy Father   ? Hypertension Neg Hx   ? Diabetes Neg Hx   ?  ?Past Surgical History:  ?Procedure Laterality Date  ? CHOLECYSTECTOMY    ? URETHRAL CYST REMOVAL N/A 08/17/2014  ? Procedure: RESECTION OF SUB- URETHRAL CYST;  Surgeon: Willodean Rosenthal, MD;  Location: [facility];  Service: Gynecology;  Laterality: N/A;  ? VARICOSE VEIN SURGERY    ? ?Social History  ? ?Occupational History  ? Not on file  ?  Tobacco Use  ? Smoking status: Never  ? Smokeless tobacco: Never  ?Vaping Use  ? Vaping Use: Never used  ?Substance and Sexual Activity  ? Alcohol use: No  ? Drug use: No  ? Sexual activity: Yes  ?  Birth control/protection: Implant  ? ? ? ? ? ? ?

## 2022-05-21 ENCOUNTER — Ambulatory Visit: Payer: Self-pay | Admitting: *Deleted

## 2022-05-21 NOTE — Telephone Encounter (Signed)
  Chief Complaint: coughing after having a cold that has continued for 3-4 weeks.  Ear was sore but has cleared up now. Symptoms: coughing a lot with yellow mucus Frequency: for last 3-4 wks Pertinent Negatives: Patient denies fever or runny nose or sore throat just the lingering cough at this point. Disposition: [x] ED /[] Urgent Care (no appt availability in office) / [] Appointment(In office/virtual)/ []  [facility]/ [] Home Care/ [] Refused Recommended Disposition /[] [facility]/ []  Follow-up with PCP Additional Notes: I offered her the [facility] unit or urgent care.  She declined a virtual visit already.  She preferred to go to the ED.   I again offered the Mobile Unit but she replied,   "I feel better about going to emergency room".

## 2022-05-21 NOTE — Telephone Encounter (Signed)
Message from [place] sent at 05/21/2022  8:46 AM EDT  Summary: cough,ear pain   Pt stated has been having a cough for going on 4- weeks pt. Pt stated that her ears are starting to hurt as well. Pt denied SOB   Pt declined virtual.   Mentioned to pt mobile clinic for tomorrow.    Pt needs spanish interpreter.           Call History   Type Contact Phone/Fax User  05/21/2022 08:44 AM EDT Phone (Incoming) Orest Dikes (Self) [phone] (H)    Reason for Disposition  [1] Continuous (nonstop) coughing interferes with work or school AND [2] no improvement using cough treatment per Care Advice  Answer Assessment - Initial Assessment Questions 1. ONSET: "When did the cough begin?"      A lot of cough with yellow mucus. Started 3 weeks ago with headaches and coughing.    The headaches are better. 1 wk before cough I could not hear for a day then after  that I had discharge but it has stopped and now I can hear.   2. SEVERITY: "How bad is the cough today?"      *No Answer* 3. SPUTUM: "Describe the color of your sputum" (none, dry cough; clear, white, yellow, green)     Yellow I'm coughing a lot.  Sore throat now.   I had a cold.    4. HEMOPTYSIS: "Are you coughing up any blood?" If so ask: "How much?" (flecks, streaks, tablespoons, etc.)     *No Answer* 5. DIFFICULTY BREATHING: "Are you having difficulty breathing?" If Yes, ask: "How bad is it?" (e.g., mild, moderate, severe)    - MILD: No SOB at rest, mild SOB with walking, speaks normally in sentences, can lie down, no retractions, pulse < 100.    - MODERATE: SOB at rest, SOB with minimal exertion and prefers to sit, cannot lie down flat, speaks in phrases, mild retractions, audible wheezing, pulse 100-120.    - SEVERE: Very SOB at rest, speaks in single words, struggling to breathe, sitting hunched forward, retractions, pulse > 120      Not asked 6. FEVER: "Do you have a fever?" If Yes, ask: "What is your  temperature, how was it measured, and when did it start?"     Fever for one day 7. CARDIAC HISTORY: "Do you have any history of heart disease?" (e.g., heart attack, congestive heart failure)      Not asked 8. LUNG HISTORY: "Do you have any history of lung disease?"  (e.g., pulmonary embolus, asthma, emphysema)     Not asked 9. PE RISK FACTORS: "Do you have a history of blood clots?" (or: recent major surgery, recent prolonged travel, bedridden)     Not asked 10. OTHER SYMPTOMS: "Do you have any other symptoms?" (e.g., runny nose, wheezing, chest pain)       Sore throat, coughing a lot.    Ear pain  11. PREGNANCY: "Is there any chance you are pregnant?" "When was your last menstrual period?"       Not asked 12. TRAVEL: "Have you traveled out of the country in the last month?" (e.g., travel history, exposures)       N/A  Protocols used: Cough - Acute Productive-A-AH

## 2022-10-02 NOTE — Telephone Encounter (Signed)
No additional notes

## 2023-03-07 ENCOUNTER — Ambulatory Visit (INDEPENDENT_AMBULATORY_CARE_PROVIDER_SITE_OTHER): Payer: Self-pay

## 2023-03-07 ENCOUNTER — Ambulatory Visit
Admission: EM | Admit: 2023-03-07 | Discharge: 2023-03-07 | Disposition: A | Discharge status: Home / Self Care | Payer: Self-pay | Attending: Urgent Care | Admitting: Urgent Care

## 2023-03-07 ENCOUNTER — Other Ambulatory Visit: Payer: Self-pay

## 2023-03-07 ENCOUNTER — Ambulatory Visit (HOSPITAL_COMMUNITY)
Admission: AD | Admit: 2023-03-07 | Discharge: 2023-03-07 | Disposition: A | Discharge status: Home / Self Care | Payer: Self-pay | Source: Other Acute Inpatient Hospital | Attending: Orthopedic Surgery | Admitting: Orthopedic Surgery

## 2023-03-07 ENCOUNTER — Encounter (HOSPITAL_COMMUNITY): Payer: Self-pay | Admitting: Orthopedic Surgery

## 2023-03-07 ENCOUNTER — Encounter (HOSPITAL_COMMUNITY)
Admission: AD | Disposition: A | Discharge status: Home / Self Care | Payer: Self-pay | Source: Other Acute Inpatient Hospital | Attending: Orthopedic Surgery

## 2023-03-07 ENCOUNTER — Ambulatory Visit (HOSPITAL_BASED_OUTPATIENT_CLINIC_OR_DEPARTMENT_OTHER): Payer: Self-pay | Admitting: Anesthesiology

## 2023-03-07 ENCOUNTER — Ambulatory Visit (HOSPITAL_COMMUNITY): Payer: Self-pay | Admitting: Anesthesiology

## 2023-03-07 DIAGNOSIS — S62631B Displaced fracture of distal phalanx of left index finger, initial encounter for open fracture: Secondary | ICD-10-CM

## 2023-03-07 DIAGNOSIS — S62631A Displaced fracture of distal phalanx of left index finger, initial encounter for closed fracture: Secondary | ICD-10-CM

## 2023-03-07 DIAGNOSIS — W230XXA Caught, crushed, jammed, or pinched between moving objects, initial encounter: Secondary | ICD-10-CM | POA: Insufficient documentation

## 2023-03-07 DIAGNOSIS — S67191A Crushing injury of left index finger, initial encounter: Secondary | ICD-10-CM | POA: Insufficient documentation

## 2023-03-07 DIAGNOSIS — S61301A Unspecified open wound of left index finger with damage to nail, initial encounter: Secondary | ICD-10-CM

## 2023-03-07 DIAGNOSIS — S62301A Unspecified fracture of second metacarpal bone, left hand, initial encounter for closed fracture: Secondary | ICD-10-CM

## 2023-03-07 DIAGNOSIS — S61311A Laceration without foreign body of left index finger with damage to nail, initial encounter: Secondary | ICD-10-CM

## 2023-03-07 HISTORY — PX: I & D EXTREMITY: SHX5045

## 2023-03-07 LAB — CBC
HCT: 37.4 % (ref 36.0–46.0)
Hemoglobin: 12.8 g/dL (ref 12.0–15.0)
MCH: 29.4 pg (ref 26.0–34.0)
MCHC: 34.2 g/dL (ref 30.0–36.0)
MCV: 85.8 fL (ref 80.0–100.0)
Platelets: 258 10*3/uL (ref 150–400)
RBC: 4.36 MIL/uL (ref 3.87–5.11)
RDW: 12.7 % (ref 11.5–15.5)
WBC: 7.2 10*3/uL (ref 4.0–10.5)
nRBC: 0 % (ref 0.0–0.2)

## 2023-03-07 LAB — SURGICAL PCR SCREEN
MRSA, PCR: NEGATIVE
Staphylococcus aureus: NEGATIVE

## 2023-03-07 LAB — POCT PREGNANCY, URINE: Preg Test, Ur: NEGATIVE

## 2023-03-07 SURGERY — IRRIGATION AND DEBRIDEMENT EXTREMITY
Anesthesia: General | Laterality: Right

## 2023-03-07 SURGERY — IRRIGATION AND DEBRIDEMENT EXTREMITY
Anesthesia: General | Site: Index Finger | Laterality: Left

## 2023-03-07 MED ORDER — ORAL CARE MOUTH RINSE: 15.0000 mL | Freq: Once | OROMUCOSAL | Status: AC

## 2023-03-07 MED ORDER — MIDAZOLAM HCL 2 MG/2ML IJ SOLN
INTRAMUSCULAR | Status: AC
  Filled 2023-03-07: qty 2

## 2023-03-07 MED ORDER — SULFAMETHOXAZOLE-TRIMETHOPRIM 800-160 MG PO TABS: 1.0000 | ORAL_TABLET | Freq: Two times a day (BID) | ORAL | 0 refills | Status: DC

## 2023-03-07 MED ORDER — MIDAZOLAM HCL 2 MG/2ML IJ SOLN
INTRAMUSCULAR | Status: DC | PRN
  Administered 2023-03-07: 2 mg via INTRAVENOUS

## 2023-03-07 MED ORDER — BUPIVACAINE HCL (PF) 0.25 % IJ SOLN
INTRAMUSCULAR | Status: AC
  Filled 2023-03-07: qty 30

## 2023-03-07 MED ORDER — CHLORHEXIDINE GLUCONATE 0.12 % MT SOLN: 15.0000 mL | Freq: Once | OROMUCOSAL | Status: AC

## 2023-03-07 MED ORDER — IBUPROFEN 600 MG PO TABS
600.0000 mg | ORAL_TABLET | Freq: Four times a day (QID) | ORAL | 0 refills | Status: DC | PRN
  Filled 2023-03-07: qty 30, 8d supply, fill #0

## 2023-03-07 MED ORDER — ONDANSETRON HCL 4 MG/2ML IJ SOLN
INTRAMUSCULAR | Status: DC | PRN
  Administered 2023-03-07: 4 mg via INTRAVENOUS

## 2023-03-07 MED ORDER — PROPOFOL 1000 MG/100ML IV EMUL
INTRAVENOUS | Status: AC
  Filled 2023-03-07: qty 100

## 2023-03-07 MED ORDER — CHLORHEXIDINE GLUCONATE 4 % EX LIQD: 60.0000 mL | Freq: Once | CUTANEOUS | Status: DC

## 2023-03-07 MED ORDER — CEFAZOLIN SODIUM-DEXTROSE 2-4 GM/100ML-% IV SOLN
2.0000 g | INTRAVENOUS | Status: AC
  Administered 2023-03-07: 2 g via INTRAVENOUS

## 2023-03-07 MED ORDER — FENTANYL CITRATE (PF) 100 MCG/2ML IJ SOLN: 25.0000 ug | INTRAMUSCULAR | Status: DC | PRN

## 2023-03-07 MED ORDER — LACTATED RINGERS IV SOLN: INTRAVENOUS | Status: DC

## 2023-03-07 MED ORDER — FENTANYL CITRATE (PF) 250 MCG/5ML IJ SOLN
INTRAMUSCULAR | Status: AC
  Filled 2023-03-07: qty 5

## 2023-03-07 MED ORDER — POVIDONE-IODINE 10 % EX SWAB
2.0000 | Freq: Once | CUTANEOUS | Status: AC
  Administered 2023-03-07: 2 via TOPICAL

## 2023-03-07 MED ORDER — AMISULPRIDE (ANTIEMETIC) 5 MG/2ML IV SOLN: 10.0000 mg | Freq: Once | INTRAVENOUS | Status: DC | PRN

## 2023-03-07 MED ORDER — CHLORHEXIDINE GLUCONATE 0.12 % MT SOLN
OROMUCOSAL | Status: AC
  Administered 2023-03-07: 15 mL via OROMUCOSAL
  Filled 2023-03-07: qty 15

## 2023-03-07 MED ORDER — HYDROCODONE-ACETAMINOPHEN 5-325 MG PO TABS
1.0000 | ORAL_TABLET | Freq: Four times a day (QID) | ORAL | 0 refills | Status: DC | PRN
  Filled 2023-03-07: qty 15, 4d supply, fill #0

## 2023-03-07 MED ORDER — BUPIVACAINE HCL (PF) 0.25 % IJ SOLN
INTRAMUSCULAR | Status: DC | PRN
  Administered 2023-03-07: 9 mL

## 2023-03-07 MED ORDER — FENTANYL CITRATE (PF) 250 MCG/5ML IJ SOLN
INTRAMUSCULAR | Status: DC | PRN
  Administered 2023-03-07: 50 ug via INTRAVENOUS

## 2023-03-07 MED ORDER — PROPOFOL 10 MG/ML IV BOLUS
INTRAVENOUS | Status: DC | PRN
  Administered 2023-03-07: 150 mg via INTRAVENOUS

## 2023-03-07 MED ORDER — CEFAZOLIN SODIUM-DEXTROSE 2-4 GM/100ML-% IV SOLN
INTRAVENOUS | Status: AC
  Filled 2023-03-07: qty 100

## 2023-03-07 MED ORDER — HYDROCODONE-ACETAMINOPHEN 5-325 MG PO TABS: ORAL_TABLET | ORAL | 0 refills | Status: DC

## 2023-03-07 MED ORDER — 0.9 % SODIUM CHLORIDE (POUR BTL) OPTIME
TOPICAL | Status: DC | PRN
  Administered 2023-03-07: 1000 mL

## 2023-03-07 MED ORDER — LIDOCAINE 2% (20 MG/ML) 5 ML SYRINGE
INTRAMUSCULAR | Status: DC | PRN
  Administered 2023-03-07: 40 mg via INTRAVENOUS

## 2023-03-07 MED ORDER — DEXAMETHASONE SODIUM PHOSPHATE 10 MG/ML IJ SOLN
INTRAMUSCULAR | Status: DC | PRN
  Administered 2023-03-07: 5 mg via INTRAVENOUS

## 2023-03-07 SURGICAL SUPPLY — 54 items
ADAPTER CATH SYR TO TUBING 38M (ADAPTER) ×1 IMPLANT
BAG COUNTER SPONGE SURGICOUNT (BAG) ×1 IMPLANT
BNDG COHESIVE 1X5 TAN STRL LF (GAUZE/BANDAGES/DRESSINGS) IMPLANT
BNDG COHESIVE 2X5 TAN ST LF (GAUZE/BANDAGES/DRESSINGS) IMPLANT
BNDG ELASTIC 3INX 5YD STR LF (GAUZE/BANDAGES/DRESSINGS) ×1 IMPLANT
BNDG ELASTIC 4X5.8 VLCR STR LF (GAUZE/BANDAGES/DRESSINGS) ×1 IMPLANT
BNDG ESMARK 4X9 LF (GAUZE/BANDAGES/DRESSINGS) IMPLANT
BNDG GAUZE DERMACEA FLUFF 4 (GAUZE/BANDAGES/DRESSINGS) ×1 IMPLANT
CANNULA VESSEL 3MM 2 BLNT TIP (CANNULA) IMPLANT
CORD BIPOLAR FORCEPS 12FT (ELECTRODE) ×1 IMPLANT
COVER SURGICAL LIGHT HANDLE (MISCELLANEOUS) ×1 IMPLANT
CUFF TOURN SGL QUICK 18X4 (TOURNIQUET CUFF) IMPLANT
CUFF TOURN SGL QUICK 24 (TOURNIQUET CUFF)
CUFF TRNQT CYL 24X4X16.5-23 (TOURNIQUET CUFF) IMPLANT
DRAIN PENROSE 12X.25 LTX STRL (MISCELLANEOUS) IMPLANT
DRAPE TOWEL STERILE LF 18X24 (DRAPES) IMPLANT
GAUZE PAD ABD 8X10 STRL (GAUZE/BANDAGES/DRESSINGS) ×2 IMPLANT
GAUZE SPONGE 4X4 12PLY STRL (GAUZE/BANDAGES/DRESSINGS) ×1 IMPLANT
GAUZE XEROFORM 1X8 LF (GAUZE/BANDAGES/DRESSINGS) ×1 IMPLANT
GLOVE BIO SURGEON STRL SZ7.5 (GLOVE) ×1 IMPLANT
GLOVE BIOGEL PI IND STRL 8 (GLOVE) ×1 IMPLANT
GLOVE BIOGEL PI IND STRL 8.5 (GLOVE) IMPLANT
GLOVE PI ORTHO PRO STRL SZ8 (GLOVE) IMPLANT
GLOVE SURG ORTHO 8.0 STRL STRW (GLOVE) IMPLANT
GOWN STRL REUS W/ TWL LRG LVL3 (GOWN DISPOSABLE) ×1 IMPLANT
GOWN STRL REUS W/ TWL XL LVL3 (GOWN DISPOSABLE) ×1 IMPLANT
GOWN STRL REUS W/TWL LRG LVL3 (GOWN DISPOSABLE) ×1
GOWN STRL REUS W/TWL XL LVL3 (GOWN DISPOSABLE) ×1
KIT BASIN OR (CUSTOM PROCEDURE TRAY) ×1 IMPLANT
KIT TURNOVER KIT B (KITS) ×1 IMPLANT
LOOP VASCLR MAXI BLUE 18IN ST (MISCELLANEOUS) IMPLANT
LOOP VASCULAR MAXI 18 BLUE (MISCELLANEOUS) ×1
LOOPS VASCLR MAXI BLUE 18IN ST (MISCELLANEOUS) ×1 IMPLANT
MANIFOLD NEPTUNE II (INSTRUMENTS) IMPLANT
NDL HYPO 25X1 1.5 SAFETY (NEEDLE) IMPLANT
NEEDLE HYPO 25X1 1.5 SAFETY (NEEDLE) ×1 IMPLANT
NS IRRIG 1000ML POUR BTL (IV SOLUTION) ×1 IMPLANT
PACK ORTHO EXTREMITY (CUSTOM PROCEDURE TRAY) ×1 IMPLANT
PAD ARMBOARD 7.5X6 YLW CONV (MISCELLANEOUS) ×2 IMPLANT
SET CYSTO W/LG BORE CLAMP LF (SET/KITS/TRAYS/PACK) IMPLANT
SOL PREP POV-IOD 4OZ 10% (MISCELLANEOUS) ×2 IMPLANT
SPIKE FLUID TRANSFER (MISCELLANEOUS) ×1 IMPLANT
SPONGE T-LAP 4X18 ~~LOC~~+RFID (SPONGE) ×1 IMPLANT
SUT CHROMIC 6 0 PS 4 (SUTURE) IMPLANT
SWAB COLLECTION DEVICE MRSA (MISCELLANEOUS) IMPLANT
SWAB CULTURE ESWAB REG 1ML (MISCELLANEOUS) IMPLANT
SYR 20ML LL LF (SYRINGE) ×1 IMPLANT
SYR CONTROL 10ML LL (SYRINGE) IMPLANT
TOWEL GREEN STERILE (TOWEL DISPOSABLE) ×1 IMPLANT
TUBE CONNECTING 12X1/4 (SUCTIONS) ×1 IMPLANT
TUBE NG 5FR 35IN ENFIT (TUBING) IMPLANT
UNDERPAD 30X36 HEAVY ABSORB (UNDERPADS AND DIAPERS) ×1 IMPLANT
VASCULAR TIE MAXI BLUE 18IN ST (MISCELLANEOUS) ×1
YANKAUER SUCT BULB TIP NO VENT (SUCTIONS) ×1 IMPLANT

## 2023-03-07 NOTE — H&P (Signed)
Linda Duncan is an 37 y.o. female.   Chief Complaint: fingertip crush HPI: 37 yo female states she smashed left index finger in door at work last night.  Seen at UC this afternoon where XR revealed distal phalanx fracture.  Nail avulsed from fold.  She reports no previous injury to the finger and no other injury at this time.  She is present with her daughter.  A spanish language video interpreter is used for the visit.  Allergies: No Known Allergies  Past Medical History:  Diagnosis Date   Medical history non-contributory    Varicose vein of leg     Past Surgical History:  Procedure Laterality Date   CHOLECYSTECTOMY     URETHRAL CYST REMOVAL N/A 08/17/2014   Procedure: RESECTION OF SUB- URETHRAL CYST;  Surgeon: Lavonia Drafts, MD;  Location: [facility];  Service: Gynecology;  Laterality: N/A;   VARICOSE VEIN SURGERY      Family History: Family History  Problem Relation Age of Onset   Healthy Mother    Healthy Father    Hypertension Neg Hx    Diabetes Neg Hx     Social History:   reports that she has never smoked. She has never used smokeless tobacco. She reports that she does not drink alcohol and does not use drugs.  Medications: Medications Prior to Admission  Medication Sig Dispense Refill   ibuprofen (ADVIL) 200 MG tablet Take 200 mg by mouth every 6 (six) hours as needed.     HYDROcodone-acetaminophen (NORCO/VICODIN) 5-325 MG tablet Take 1 tablet by mouth every 6 (six) hours as needed for severe pain. 15 tablet 0   ibuprofen (ADVIL) 600 MG tablet Take 1 tablet (600 mg total) by mouth every 6 (six) hours as needed. 30 tablet 0    Results for orders placed or performed during the hospital encounter of 03/07/23 (from the past 48 hour(s))  CBC     Status: None   Collection Time: 03/07/23  3:56 PM  Result Value Ref Range   WBC 7.2 4.0 - 10.5 K/uL   RBC 4.36 3.87 - 5.11 MIL/uL   Hemoglobin 12.8 12.0 - 15.0 g/dL   HCT 37.4 36.0 - 46.0 %   MCV 85.8 80.0 -  100.0 fL   MCH 29.4 26.0 - 34.0 pg   MCHC 34.2 30.0 - 36.0 g/dL   RDW 12.7 11.5 - 15.5 %   Platelets 258 150 - 400 K/uL   nRBC 0.0 0.0 - 0.2 %    Comment: Performed at [facility], [place] [address], [place], [place] [address]  Pregnancy, urine POC     Status: None   Collection Time: 03/07/23  4:30 PM  Result Value Ref Range   Preg Test, Ur NEGATIVE NEGATIVE    Comment:        THE SENSITIVITY OF THIS METHODOLOGY IS >24 mIU/mL     DG Finger Index Right  Result Date: 03/07/2023 CLINICAL DATA:  Index finger injury. Patient jammed finger in a door yesterday. EXAM: RIGHT INDEX FINGER 2+V COMPARISON:  None Available. FINDINGS: There is a mildly comminuted extra-articular fracture involving the distal 2nd phalanx. There is associated soft tissue injury, and this fracture may be open. No foreign body or soft tissue emphysema. No dislocation or other fractures identified. IMPRESSION: Mildly comminuted extra-articular fracture of the distal 2nd phalanx with associated soft tissue injury. Electronically Signed   By: Richardean Sale M.D.   On: 03/07/2023 13:24      Blood pressure 103/70,  pulse 70, temperature 98.2 F (36.8 C), temperature source Oral, resp. rate 17, height 5' (1.524 m), weight 55.3 kg, last menstrual period 02/25/2023, SpO2 100 %.  General appearance: alert, cooperative, and appears stated age Head: Normocephalic, without obvious abnormality, atraumatic Neck: supple, symmetrical, trachea midline Extremities: Intact sensation and capillary refill all digits.  +epl/fpl/io.  Left index finger with skin laceration ulnarly and nail avulsion from fold. Pulses: 2+ and symmetric Skin: Skin color, texture, turgor normal. No rashes or lesions Neurologic: Grossly normal Incision/Wound: as above  Assessment/Plan Left index finger crush injury.  Recommend OR for irrigation and debridement, reduction and possible pinning open fracture, repair skin and nail bed lacerations.  Risks,  benefits and alternatives of surgery were discussed including risks of blood loss, infection, damage to nerves/vessels/tendons/ligament/bone, failure of surgery, need for additional surgery, complication with wound healing, stiffness, nail deformity.  She voiced understanding of these risks and elected to proceed.    [name] 03/07/2023, 4:48 PM

## 2023-03-07 NOTE — Anesthesia Procedure Notes (Signed)
Procedure Name: LMA Insertion Date/Time: 03/07/2023 5:14 PM  Performed by: Elvin So, CRNAPre-anesthesia Checklist: Patient identified, Emergency Drugs available, Suction available and Patient being monitored Patient Re-evaluated:Patient Re-evaluated prior to induction Oxygen Delivery Method: Circle System Utilized Preoxygenation: Pre-oxygenation with 100% oxygen Induction Type: IV induction Ventilation: Mask ventilation without difficulty LMA: LMA inserted LMA Size: 4.0 Number of attempts: 1 Airway Equipment and Method: Bite block Placement Confirmation: positive ETCO2 Tube secured with: Tape Dental Injury: Teeth and Oropharynx as per pre-operative assessment

## 2023-03-07 NOTE — Transfer of Care (Signed)
Immediate Anesthesia Transfer of Care Note  Patient: Linda Duncan  Procedure(s) Performed: IRRIGATION AND DEBRIDEMENT OF LEFT INDEX FINGER (Left: Index Finger)  Patient Location: PACU  Anesthesia Type:General  Level of Consciousness: sedated and drowsy  Airway & Oxygen Therapy: Patient Spontanous Breathing and Patient connected to face mask oxygen  Post-op Assessment: Report given to RN, Post -op Vital signs reviewed and stable, and Patient moving all extremities  Post vital signs: Reviewed and stable  Last Vitals:  Vitals Value Taken Time  BP 97/71 03/07/23 1800  Temp    Pulse 58 03/07/23 1801  Resp 16 03/07/23 1801  SpO2 100 % 03/07/23 1801  Vitals shown include unvalidated device data.  Last Pain:  Vitals:   03/07/23 1631  TempSrc: Oral  PainSc: 3          Complications: No notable events documented.

## 2023-03-07 NOTE — ED Triage Notes (Signed)
Pt presents with c/o left index finger pain. States she jammed her finger in a door yesterday. States she cannot bear the pain. Pt states her nail seems to have fallen off. States she was bleeding a lot last night and states she felt she was going to pass out.

## 2023-03-07 NOTE — Op Note (Signed)
NAME: Linda Duncan MEDICAL RECORD NO: [id number] DATE OF BIRTH: 05-15-1986 FACILITY: [facility] LOCATION: [facility] OR PHYSICIAN: Tennis Must, MD   OPERATIVE REPORT   DATE OF PROCEDURE: 03/07/23    PREOPERATIVE DIAGNOSIS: Left index fingertip crush injury   POSTOPERATIVE DIAGNOSIS: Left index finger open distal phalanx fracture with skin and nailbed lacerations   PROCEDURE: 1.  Left index finger irrigation debridement of open distal phalanx fracture including removal of bone fragments 2.  Left index finger open reduction open distal phalanx fracture 3.  Left index finger repair of nailbed laceration   SURGEON:  [name], M.D.   ASSISTANT: none   ANESTHESIA:  General   INTRAVENOUS FLUIDS:  Per anesthesia flow sheet.   ESTIMATED BLOOD LOSS:  Minimal.   COMPLICATIONS:  None.   SPECIMENS:  none   TOURNIQUET TIME:    Total Tourniquet Time Documented: Upper Arm (Left) - 23 minutes Total: Upper Arm (Left) - 23 minutes    DISPOSITION:  Stable to PACU.   INDICATIONS: 37 year old female states she smashed her left index finger in a door at work last night.  She was seen at urgent care today and radiographs were taken revealing a distal phalanx fracture.  Recommended irrigation and debridement with reduction and repair of skin and nailbed lacerations in the operating room.  Risks, benefits and alternatives of surgery were discussed including the risks of blood loss, infection, damage to nerves, vessels, tendons, ligaments, bone for surgery, need for additional surgery, complications with wound healing, continued pain, stiffness, nail deformity  She voiced understanding of these risks and elected to proceed.  OPERATIVE COURSE:  After being identified preoperatively by myself,  the patient and I agreed on the procedure and site of the procedure.  The surgical site was marked.  Surgical consent had been signed. Preoperative IV antibiotic prophylaxis was given. She was transferred  to the operating room and placed on the operating table in supine position with the Left upper extremity on an arm board.  General anesthesia was induced by the anesthesiologist.  Left upper extremity was prepped and draped in normal sterile orthopedic fashion.  A surgical pause was performed between the surgeons, anesthesia, and operating room staff and all were in agreement as to the patient, procedure, and site of procedure.  Tourniquet at the proximal aspect of the extremity was inflated to 250 mmHg after exsanguination of the arm with an Esmarch bandage.  The nail was removed with a freer elevator.  The wound was explored.  There was no gross contamination.  There was contaminated hematoma which was removed.  The knife was used to sharply debride the skin and subcutaneous tissues and removed 2 small bone fragments.  The laceration to the nailbed was oblique in nature.  It coursed into the skin of the finger at the radial side.  There was laceration of the dorsal nail fold on the ulnar side.  The wound and open fracture were copiously irrigated with sterile saline by bulb syringe.  6-0 chromic suture was then used to reapproximate the nailbed edges.  A vessel loop drain was placed into the wound at the fracture site and exiting through the skin laceration at the radial side of the finger.  The dorsal nail fold was repaired with the 6-0 chromic suture as well.  A piece of Xeroform was placed in the nail fold and the wounds dressed with sterile Xeroform 4 x 4 and wrapped with a Coban dressing lightly.  An AlumaFoam splint was placed  and wrapped lightly with Coban dressing.  A digital block was performed with quarter percent plain Marcaine to aid in postoperative analgesia.  The tourniquet was deflated at 23 minutes.  Fingertips were pink with brisk capillary refill after deflation of tourniquet.  The operative  drapes were broken down.  The patient was awoken from anesthesia safely.  She was transferred back to  the stretcher and taken to PACU in stable condition.  I will see her back in the office in 1 week for postoperative followup.  I will give her a prescription for Norco 5/325 1-2 tabs PO q6 hours prn pain, dispense # 20 and Bactrim DS 1 p.o. twice daily x 7 days.   [name], MD Electronically signed, 03/07/23

## 2023-03-07 NOTE — Discharge Instructions (Addendum)
Dr. Fredna Dow will see you today at [facility] will need surgical repair of your left index finger injury. Please go to the main hospital at [facility]. Go through admissions, it should be the first door on the left at the hospital entrance. Go there now.

## 2023-03-07 NOTE — Anesthesia Preprocedure Evaluation (Addendum)
Anesthesia Evaluation  Patient identified by MRN, date of birth, ID band Patient awake    Reviewed: Allergy & Precautions, NPO status , Patient's Chart, lab work & pertinent test results  Airway Mallampati: II  TM Distance: >3 FB Neck ROM: Full    Dental  (+) Dental Advisory Given   Pulmonary neg pulmonary ROS   breath sounds clear to auscultation       Cardiovascular negative cardio ROS  Rhythm:Regular Rate:Normal     Neuro/Psych negative neurological ROS     GI/Hepatic negative GI ROS, Neg liver ROS,,,  Endo/Other  negative endocrine ROS    Renal/GU negative Renal ROS     Musculoskeletal   Abdominal   Peds  Hematology negative hematology ROS (+)   Anesthesia Other Findings   Reproductive/Obstetrics                             Anesthesia Physical Anesthesia Plan  ASA: 1 and emergent  Anesthesia Plan: General   Post-op Pain Management: Ofirmev IV (intra-op)* and Toradol IV (intra-op)*   Induction: Intravenous  PONV Risk Score and Plan: 3 and Dexamethasone, Ondansetron, Midazolam and Treatment may vary due to age or medical condition  Airway Management Planned: LMA  Additional Equipment:   Intra-op Plan:   Post-operative Plan: Extubation in OR  Informed Consent: I have reviewed the patients History and Physical, chart, labs and discussed the procedure including the risks, benefits and alternatives for the proposed anesthesia with the patient or authorized representative who has indicated his/her understanding and acceptance.     Dental advisory given  Plan Discussed with: CRNA  Anesthesia Plan Comments:        Anesthesia Quick Evaluation

## 2023-03-07 NOTE — Discharge Instructions (Signed)

## 2023-03-07 NOTE — ED Notes (Signed)
Patient is being discharged from the Urgent Care and sent to the Emergency Department via POV. Per Jaynee Eagles PA-C  , patient is in need of higher level of care due to need for further evaluation . Patient is aware and verbalizes understanding of plan of care.  Vitals:   03/07/23 1251  BP: 120/78  Pulse: 69  Resp: 18  Temp: 98.3 F (36.8 C)  SpO2: 98%

## 2023-03-07 NOTE — ED Provider Notes (Signed)
[facility] - URGENT CARE CENTER  Note:  This document was prepared using Systems analyst and may include unintentional dictation errors.  MRN: [id number] DOB: [birth year]  Subjective:   Linda Duncan is a 37 y.o. female presenting for suffering a left index finger injury yesterday.  Patient accidentally closed a wooden door on it and then pulled her hand out.  This caused severe injury of the finger including significant bleeding, swelling, bony deformity.  Patient covered the wound and tried to go to work yesterday and today.  Unfortunately today she could no longer tolerate the pain and decided to come in.  Last Tdap was updated 02/24/2016.  No current facility-administered medications for this encounter. No current outpatient medications on file.   No Known Allergies  Past Medical History:  Diagnosis Date   Medical history non-contributory    Varicose vein of leg      Past Surgical History:  Procedure Laterality Date   CHOLECYSTECTOMY     URETHRAL CYST REMOVAL N/A 08/17/2014   Procedure: RESECTION OF SUB- URETHRAL CYST;  Surgeon: Lavonia Drafts, MD;  Location: [facility];  Service: Gynecology;  Laterality: N/A;   VARICOSE VEIN SURGERY      Family History  Problem Relation Age of Onset   Healthy Mother    Healthy Father    Hypertension Neg Hx    Diabetes Neg Hx     Social History   Tobacco Use   Smoking status: Never   Smokeless tobacco: Never  Vaping Use   Vaping Use: Never used  Substance Use Topics   Alcohol use: No   Drug use: No    ROS   Objective:   Vitals: BP 120/78 (BP Location: Right Arm)   Pulse 69   Temp 98.3 F (36.8 C) (Oral)   Resp 18   SpO2 98%   Physical Exam Constitutional:      General: She is not in acute distress.    Appearance: Normal appearance. She is well-developed. She is not ill-appearing, toxic-appearing or diaphoretic.  HENT:     Head: Normocephalic and atraumatic.     Nose: Nose normal.      Mouth/Throat:     Mouth: Mucous membranes are moist.  Eyes:     General: No scleral icterus.       Right eye: No discharge.        Left eye: No discharge.     Extraocular Movements: Extraocular movements intact.  Cardiovascular:     Rate and Rhythm: Normal rate.  Pulmonary:     Effort: Pulmonary effort is normal.  Musculoskeletal:       Hands:  Skin:    General: Skin is warm and dry.  Neurological:     General: No focal deficit present.     Mental Status: She is alert and oriented to person, place, and time.  Psychiatric:        Mood and Affect: Mood normal.        Behavior: Behavior normal.        A digital block was performed using 4 cc of 1% lidocaine without epinephrine for evaluation of her wound, wound cleansing.  DG Finger Index Right  Result Date: 03/07/2023 CLINICAL DATA:  Index finger injury. Patient jammed finger in a door yesterday. EXAM: RIGHT INDEX FINGER 2+V COMPARISON:  None Available. FINDINGS: There is a mildly comminuted extra-articular fracture involving the distal 2nd phalanx. There is associated soft tissue injury, and this fracture may be open. No foreign body  or soft tissue emphysema. No dislocation or other fractures identified. IMPRESSION: Mildly comminuted extra-articular fracture of the distal 2nd phalanx with associated soft tissue injury. Electronically Signed   By: Richardean Sale M.D.   On: 03/07/2023 13:24     Assessment and Plan :   PDMP not reviewed this encounter.  1. Open displaced fracture of distal phalanx of left index finger, initial encounter   2. Avulsion of nail of left index finger   3. Laceration of left index finger without foreign body with damage to nail, initial encounter    Consulted with orthopedic PA on call, PA Jeffery. Patient will be seen now by the hand surgeon on call, Dr. Fredna Dow.  Wound was cleansed, I placed a bulky pressure dressing.  Patient is to schedule ibuprofen for pain relief, use hydrocodone for  breakthrough pain.  She will report to the hospital now.   Jaynee Eagles, [place] 03/07/23 [id number]

## 2023-03-08 ENCOUNTER — Other Ambulatory Visit: Payer: Self-pay

## 2023-03-08 ENCOUNTER — Encounter (HOSPITAL_COMMUNITY): Payer: Self-pay | Admitting: Orthopedic Surgery

## 2023-03-08 NOTE — Anesthesia Postprocedure Evaluation (Signed)
Anesthesia Post Note  Patient: Estate agent  Procedure(s) Performed: IRRIGATION AND DEBRIDEMENT OF LEFT INDEX FINGER (Left: Index Finger)     Patient location during evaluation: PACU Anesthesia Type: General Level of consciousness: awake and alert Pain management: pain level controlled Vital Signs Assessment: post-procedure vital signs reviewed and stable Respiratory status: spontaneous breathing, nonlabored ventilation, respiratory function stable and patient connected to nasal cannula oxygen Cardiovascular status: blood pressure returned to baseline and stable Postop Assessment: no apparent nausea or vomiting Anesthetic complications: no   No notable events documented.  Last Vitals:  Vitals:   03/07/23 1815 03/07/23 1830  BP: 112/81 101/80  Pulse: 69 61  Resp: 17 19  Temp:    SpO2: 100% 100%    Last Pain:  Vitals:   03/07/23 1830  TempSrc:   PainSc: 0-No pain                 Tiajuana Amass

## 2023-03-20 ENCOUNTER — Other Ambulatory Visit: Payer: Self-pay

## 2023-06-10 ENCOUNTER — Ambulatory Visit: Payer: Self-pay | Admitting: Nurse Practitioner

## 2023-08-14 ENCOUNTER — Encounter: Payer: Self-pay | Admitting: Nurse Practitioner

## 2023-08-14 ENCOUNTER — Other Ambulatory Visit (HOSPITAL_COMMUNITY)
Admission: RE | Admit: 2023-08-14 | Discharge: 2023-08-14 | Disposition: A | Discharge status: Home / Self Care | Payer: Self-pay | Source: Ambulatory Visit | Attending: Nurse Practitioner | Admitting: Nurse Practitioner

## 2023-08-14 ENCOUNTER — Ambulatory Visit: Payer: Self-pay | Attending: Nurse Practitioner | Admitting: Nurse Practitioner

## 2023-08-14 VITALS — BP 125/72 | HR 71 | Ht 60.0 in | Wt 118.8 lb

## 2023-08-14 DIAGNOSIS — Z124 Encounter for screening for malignant neoplasm of cervix: Secondary | ICD-10-CM

## 2023-08-14 DIAGNOSIS — E162 Hypoglycemia, unspecified: Secondary | ICD-10-CM

## 2023-08-14 LAB — CERVICOVAGINAL ANCILLARY ONLY
Bacterial Vaginitis (gardnerella): POSITIVE — AB
Candida Glabrata: NEGATIVE
Candida Vaginitis: NEGATIVE
Chlamydia: NEGATIVE
Comment: NEGATIVE
Comment: NEGATIVE
Comment: NEGATIVE
Comment: NEGATIVE
Comment: NEGATIVE
Comment: NORMAL
Neisseria Gonorrhea: NEGATIVE
Trichomonas: NEGATIVE

## 2023-08-14 NOTE — Progress Notes (Signed)
Assessment & Plan:  Cyria was seen today for gynecologic exam.  Diagnoses and all orders for this visit:  Encounter for Papanicolaou smear of cervix -     Cervicovaginal ancillary only -     Cytology - PAP  Hypoglycemia -     CMP14+EGFR    Patient has been counseled on age-appropriate routine health concerns for screening and prevention. These are reviewed and up-to-date. Referrals have been placed accordingly. Immunizations are up-to-date or declined.    Subjective:   Chief Complaint  Patient presents with   Gynecologic Exam    Linda Duncan 37 y.o. female presents to office today PAP smear. She has no questions or concerns today.  VRI was used to communicate directly with patient for the entire encounter including providing detailed patient instructions.    Review of Systems  Constitutional: Negative.  Negative for chills, fever, malaise/fatigue and weight loss.  Respiratory: Negative.  Negative for cough, shortness of breath and wheezing.   Cardiovascular: Negative.  Negative for chest pain, orthopnea and leg swelling.  Gastrointestinal:  Negative for abdominal pain.  Genitourinary: Negative.  Negative for flank pain.  Skin: Negative.  Negative for rash.  Psychiatric/Behavioral:  Negative for suicidal ideas.     Past Medical History:  Diagnosis Date   Medical history non-contributory    Varicose vein of leg     Past Surgical History:  Procedure Laterality Date   CHOLECYSTECTOMY     I & D EXTREMITY Left 03/07/2023   Procedure: IRRIGATION AND DEBRIDEMENT OF LEFT INDEX FINGER;  Surgeon: Betha Loa, MD;  Location: [facility];  Service: Orthopedics;  Laterality: Left;   URETHRAL CYST REMOVAL N/A 08/17/2014   Procedure: RESECTION OF SUB- URETHRAL CYST;  Surgeon: Willodean Rosenthal, MD;  Location: [facility];  Service: Gynecology;  Laterality: N/A;   VARICOSE VEIN SURGERY      Family History  Problem Relation Age of Onset   Healthy Mother    Healthy Father     Hypertension Neg Hx    Diabetes Neg Hx     Social History Reviewed with no changes to be made today.   Outpatient Medications Prior to Visit  Medication Sig Dispense Refill   HYDROcodone-acetaminophen (NORCO) 5-325 MG tablet 1-2 tabs po q6 hours prn pain (Patient not taking: Reported on 08/14/2023) 20 tablet 0   ibuprofen (ADVIL) 600 MG tablet Take 1 tablet (600 mg total) by mouth every 6 (six) hours as needed. (Patient not taking: Reported on 08/14/2023) 30 tablet 0   sulfamethoxazole-trimethoprim (BACTRIM DS) 800-160 MG tablet Take 1 tablet by mouth 2 (two) times daily. (Patient not taking: Reported on 08/14/2023) 14 tablet 0   No facility-administered medications prior to visit.    No Known Allergies     Objective:    BP 125/72 (BP Location: Left Arm, Patient Position: Sitting, Cuff Size: Normal)   Pulse 71   Ht 5' (1.524 m)   Wt 118 lb 12.8 oz (53.9 kg)   LMP 08/11/2023   SpO2 100%   BMI 23.20 kg/m  Wt Readings from Last 3 Encounters:  08/14/23 118 lb 12.8 oz (53.9 kg)  03/07/23 122 lb (55.3 kg)  01/19/22 125 lb 6 oz (56.9 kg)    Physical Exam Exam conducted with a chaperone present.  Constitutional:      Appearance: She is well-developed.  HENT:     Head: Normocephalic.  Cardiovascular:     Rate and Rhythm: Normal rate and regular rhythm.     Heart sounds:  Normal heart sounds.  Pulmonary:     Effort: Pulmonary effort is normal.     Breath sounds: Normal breath sounds.  Abdominal:     General: Bowel sounds are normal.     Palpations: Abdomen is soft.     Hernia: There is no hernia in the left inguinal area.  Genitourinary:    Exam position: Lithotomy position.     Labia:        Right: No rash, tenderness, lesion or injury.        Left: No rash, tenderness, lesion or injury.      Vagina: Normal. No signs of injury and foreign body. No vaginal discharge, erythema, tenderness or bleeding.     Cervix: Normal.     Uterus: Not deviated and not enlarged.       Adnexa:        Right: No mass, tenderness or fullness.         Left: No mass, tenderness or fullness.       Rectum: Normal. No external hemorrhoid.  Lymphadenopathy:     Lower Body: No right inguinal adenopathy. No left inguinal adenopathy.  Skin:    General: Skin is warm and dry.  Neurological:     Mental Status: She is alert and oriented to person, place, and time.  Psychiatric:        Behavior: Behavior normal.        Thought Content: Thought content normal.        Judgment: Judgment normal.          Patient has been counseled extensively about nutrition and exercise as well as the importance of adherence with medications and regular follow-up. The patient was given clear instructions to go to ER or return to medical center if symptoms don't improve, worsen or new problems develop. The patient verbalized understanding.   Follow-up: Return if symptoms worsen or fail to improve.   Claiborne Rigg, FNP-BC [facility] and [facility] [place], [place] [phone]   08/14/2023, 10:13 AM

## 2023-08-15 LAB — CMP14+EGFR
ALT: 13 IU/L (ref 0–32)
AST: 20 IU/L (ref 0–40)
Albumin: 4.1 g/dL (ref 3.9–4.9)
Alkaline Phosphatase: 52 IU/L (ref 44–121)
BUN/Creatinine Ratio: 13 (ref 9–23)
BUN: 9 mg/dL (ref 6–20)
Bilirubin Total: 0.3 mg/dL (ref 0.0–1.2)
CO2: 24 mmol/L (ref 20–29)
Calcium: 9.4 mg/dL (ref 8.7–10.2)
Chloride: 105 mmol/L (ref 96–106)
Creatinine, Ser: 0.69 mg/dL (ref 0.57–1.00)
Globulin, Total: 2.7 g/dL (ref 1.5–4.5)
Glucose: 77 mg/dL (ref 70–99)
Potassium: 4.2 mmol/L (ref 3.5–5.2)
Sodium: 140 mmol/L (ref 134–144)
Total Protein: 6.8 g/dL (ref 6.0–8.5)
eGFR: 114 mL/min/{1.73_m2} (ref >59)

## 2023-08-22 LAB — CYTOLOGY - PAP
Chlamydia: NEGATIVE
Comment: NEGATIVE
Comment: NEGATIVE
Comment: NORMAL
Diagnosis: NEGATIVE
High risk HPV: NEGATIVE
Neisseria Gonorrhea: NEGATIVE

## 2023-08-23 ENCOUNTER — Other Ambulatory Visit: Payer: Self-pay

## 2023-08-23 ENCOUNTER — Telehealth: Payer: Self-pay

## 2023-08-23 ENCOUNTER — Other Ambulatory Visit: Payer: Self-pay | Admitting: Nurse Practitioner

## 2023-08-23 DIAGNOSIS — B9689 Other specified bacterial agents as the cause of diseases classified elsewhere: Secondary | ICD-10-CM

## 2023-08-23 MED ORDER — METRONIDAZOLE 500 MG PO TABS
500.0000 mg | ORAL_TABLET | Freq: Two times a day (BID) | ORAL | 0 refills | Status: AC
  Filled 2023-08-23: qty 14, 7d supply, fill #0

## 2023-08-23 NOTE — Telephone Encounter (Signed)
Copied from CRM [id number]. Topic: General - Inquiry >> Aug 23, 2023  4:11 PM Runell Gess P wrote: Reason for CRM: pt called for the results and wants to know if medication has been sent to the pharmacy yet.  CB@ [phone]

## 2023-08-26 NOTE — Telephone Encounter (Signed)
Patient aware of medicine sent to pharmacy.

## 2023-08-28 ENCOUNTER — Other Ambulatory Visit: Payer: Self-pay

## 2023-09-18 ENCOUNTER — Telehealth: Payer: Self-pay

## 2023-09-18 NOTE — Telephone Encounter (Addendum)
Patient came in and filled out a ROI to get her pap results

## 2023-11-26 ENCOUNTER — Ambulatory Visit: Payer: Self-pay | Admitting: *Deleted

## 2023-11-26 NOTE — Telephone Encounter (Signed)
  Chief Complaint: Burning with urination and smell to urine. Symptoms: above Frequency: Daily Pertinent Negatives: Patient denies blood in urine, flank pain.   Disposition: [] ED /[] Urgent Care (no appt availability in office) / [x] Appointment(In office/virtual)/ []  [facility]/ [] Home Care/ [] Refused Recommended Disposition /[] [facility]/ []  Follow-up with PCP Additional Notes: Appt made with next available provider Georgian Co, PA-C for 12/24/2022 at 9:10.   Instructed to go on to the urgent care if fever, blood in urine, or flank pain occurs before this appt.    She verbalized understanding.

## 2023-11-26 NOTE — Telephone Encounter (Signed)
Message from Lennox Pippins sent at 11/26/2023  9:28 AM EST  Summary: Burning while Urinating   Patient has called and states she is experiencing burning while urinating. Patient states she thought it was just her period but now her period is over and she still has the burning while urinating.  No other symptoms & no abdominal pain. No appointments with [facility].  Please advise and contact patient back at phone # [phone]          Call History  Contact Date/Time Type Contact Phone/Fax User  11/26/2023 09:24 AM EST Phone (Incoming) [place], [place] (Self) [phone] Rexene Edison) [place], [place] Melia   Reason for Disposition  Unusual vaginal discharge (e.g., bad smelling, yellow, green, or foamy-white)    No vaginal discharge but burning with urination.  Answer Assessment - Initial Assessment Questions 1. SEVERITY: "How bad is the pain?"  (e.g., Scale 1-10; mild, moderate, or severe)   - MILD (1-3): complains slightly about urination hurting   - MODERATE (4-7): interferes with normal activities     - SEVERE (8-10): excruciating, unwilling or unable to urinate because of the pain      Using [employer] [place] [phone] to return her call.  Having burning with urination.   2. FREQUENCY: "How many times have you had painful urination today?"      Every time I pee. 3. PATTERN: "Is pain present every time you urinate or just sometimes?"      Yes 4. ONSET: "When did the painful urination start?"      1 week ago  I had my period and I thought it was that but now my period is over.   I'm still burning when I urinate.   5. FEVER: "Do you have a fever?" If Yes, ask: "What is your temperature, how was it measured, and when did it start?"     No 6. PAST UTI: "Have you had a urine infection before?" If Yes, ask: "When was the last time?" and "What happened that time?"      A while ago I was given medication/pills.   It was for a UTI.    I feel burning when I pee.   7. CAUSE: "What do you  think is causing the painful urination?"  (e.g., UTI, scratch, Herpes sore)     Infection 8. OTHER SYMPTOMS: "Do you have any other symptoms?" (e.g., blood in urine, flank pain, genital sores, urgency, vaginal discharge)     No flank pain.  No lood in urine.   9. PREGNANCY: "Is there any chance you are pregnant?" "When was your last menstrual period?"     No  Protocols used: Urination Pain - Female-A-AH

## 2023-12-25 ENCOUNTER — Ambulatory Visit: Payer: Self-pay | Admitting: Physician Assistant

## 2024-03-09 ENCOUNTER — Ambulatory Visit (INDEPENDENT_AMBULATORY_CARE_PROVIDER_SITE_OTHER): Payer: Self-pay | Admitting: Primary Care

## 2024-03-09 ENCOUNTER — Encounter (INDEPENDENT_AMBULATORY_CARE_PROVIDER_SITE_OTHER): Payer: Self-pay | Admitting: Primary Care

## 2024-03-09 VITALS — BP 105/71 | HR 82 | Resp 16 | Ht 59.5 in | Wt 119.6 lb

## 2024-03-09 DIAGNOSIS — M25579 Pain in unspecified ankle and joints of unspecified foot: Secondary | ICD-10-CM

## 2024-03-09 DIAGNOSIS — M25531 Pain in right wrist: Secondary | ICD-10-CM

## 2024-03-09 DIAGNOSIS — M25532 Pain in left wrist: Secondary | ICD-10-CM

## 2024-03-09 NOTE — Progress Notes (Signed)
 [facility]  Linda Duncan, is a 38 y.o. female  [id number]  [id number]  DOB - 08-06-1986  Nodules bilateral wrist  pain top of foot increase with walking       Subjective:   Linda Duncan is a 38 y.o. Hispanic female ( interpreter [place] )here for acute visit. Nodules bilateral wrist ( positive for Tinel's sign and Phalen's test .   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 Also, c/o  (6/10) pain top of foot increase with walking Patient has No headache, No chest pain, No abdominal pain - No Nausea, No new weakness tingling or numbness, No Cough - shortness of breath Foot Pain   No problems updated.  Comprehensive ROS Pertinent positive and negative noted in HPI   No Known Allergies  Past Medical History:  Diagnosis Date   Medical history non-contributory    Varicose vein of leg     No current outpatient medications on file prior to visit.   No current facility-administered medications on file prior to visit.   Health Maintenance  Topic Date Due   Flu Shot  07/18/2023   COVID-19 Vaccine (1 - 2024-25 season) Never done   DTaP/Tdap/Td vaccine (2 - Td or Tdap) 02/23/2026   Pap with HPV screening  08/13/2028   Hepatitis C Screening  Completed   HIV Screening  Completed   HPV Vaccine  Aged Out    Objective:  BP 105/71   Pulse 82   Resp 16   Ht 4' 11.5" (1.511 m)   Wt 119 lb 9.6 oz (54.3 kg)   SpO2 99%   BMI 23.75 kg/m    Physical Exam Vitals reviewed.  Constitutional:      Appearance: Normal appearance. She is obese.  HENT:     Head: Normocephalic.     Right Ear: Tympanic membrane, ear canal and external ear normal.     Left Ear: Tympanic membrane, ear canal and external ear normal.     Nose: Nose normal.     Mouth/Throat:     Mouth: Mucous membranes are moist.  Eyes:     Extraocular Movements: Extraocular movements intact.     Pupils: Pupils are equal, round, and reactive to  light.  Cardiovascular:     Rate and Rhythm: Normal rate.  Pulmonary:     Effort: Pulmonary effort is normal.     Breath sounds: Normal breath sounds.  Abdominal:     General: Bowel sounds are normal.     Palpations: Abdomen is soft.  Musculoskeletal:        General: Normal range of motion.     Cervical back: Normal range of motion.  Skin:    General: Skin is warm and dry.  Neurological:     Mental Status: She is alert and oriented to person, place, and time.  Psychiatric:        Mood and Affect: Mood normal.        Behavior: Behavior normal.        Thought Content: Thought content normal.    Assessment & Plan  Linda Duncan was seen today for bumps and foot pain.  Diagnoses and all orders for this visit:  Pain in both wrists  -     Ambulatory referral to Orthopedic Surgery  Pain in joint involving ankle and foot, unspecified laterality -     Ambulatory referral to Orthopedic Surgery    Patient have been counseled extensively about nutrition and exercise.  Other issues discussed during this visit include: low cholesterol diet, weight control and daily exercise, foot care, annual eye examinations at Ophthalmology, importance of adherence with medications and regular follow-up. We also discussed long term complications of uncontrolled diabetes and hypertension.   Schedule with PCP  The patient was given clear instructions to go to ER or return to medical center if symptoms don't improve, worsen or new problems develop. The patient verbalized understanding. The patient was told to call to get lab results if they haven't heard anything in the next week.   This note has been created with Education officer, environmental. Any transcriptional errors are unintentional.   Grayce Sessions, NP 03/09/2024, 10:06 AM

## 2024-03-09 NOTE — Patient Instructions (Signed)
 Pinzamiento del nervio de la mueca (sndrome del tnel carpiano): Qu debe saber Pinched Nerve in the Wrist (Carpal Tunnel Syndrome): What to Know  El pinzamiento del nervio de la [place] (sndrome del tnel carpiano o STC) es un problema nervioso que Passenger transport manager, entumecimiento y debilidad en la [place], la mano y los dedos. El tnel carpiano es un espacio estrecho que se encuentra en el lado palmar de la [place]. Los movimientos repetitivos de la mueca o determinadas enfermedades pueden causar hinchazn en el tnel. Esta hinchazn puede comprimir el nervio principal de la mueca (el nervio mediano). Cules son las causas? Las causas del STC pueden ser las siguientes: Mover la mano y la [place] y otra vez mientras realiza una tarea. Lastimarse la [place]. Artritis. Una bolsa llena de lquido (quiste) o un crecimiento (tumor) en el tnel carpiano. Acumulacin de lquido Academic librarian. Uso de herramientas que vibran. En algunos casos, se desconoce la causa del STC. Qu incrementa el riesgo? Es ms probable que tenga el STC si: Tiene un trabajo en el que deba hacer estas cosas: Mover la mano con firmeza una y [place]. Trabajar con herramientas que vibran, como taladros o lijadoras. Es mujer. Tiene diabetes, obesidad, problemas de tiroides o insuficiencia renal. Cules son los signos o sntomas? Los sntomas de esta afeccin incluyen: Sensacin de hormigueo en los dedos. Puede sentir [employer], el dedo ndice o el dedo corazn. Hormigueo o prdida de la sensibilidad de Engineer, site. Dolor en todo el brazo. Este dolor puede empeorar al flexionar la [place] y el codo durante [place]. Dolor en la mueca que sube por el brazo hasta el hombro. Dolor que baja hasta la palma de la mano o los dedos. Debilidad en las manos. Puede resultarle difcil tomar y Personal assistant. Los sntomas pueden empeorar durante la noche. Cmo se diagnostica? El STC se diagnostica mediante  la historia clnica y un examen fsico. Tambin pueden hacerle pruebas y estudios de diagnstico por imgenes para: Revisar las Catering manager que los nervios les envan a los msculos. Determinar si las seales elctricas pasan correctamente por los nervios. Determinar las posibles causas del STC. Estos incluyen radiografas, ecografa y Health visitor (RM). Cmo se trata? El STC puede tratarse de las siguientes maneras: Cambios en el estilo de vida. Se le pedir que deje o cambie la actividad que caus el problema. Fisioterapia. Puede incluir: Ejercicios que estiran y [employer] y tendones de la mueca y la [place]. Ejercicios de deslizamiento o movilizacin de los nervios. Estos ayudan a que los nervios se muevan fluidamente dentro de los tejidos [employer] rodean. Terapia ocupacional. Aprender a usar la mano nuevamente. Medicamentos para Chief Technology Officer y la hinchazn. Es posible que le apliquen inyecciones en la [place]. Una frula o un dispositivo ortopdico para la [place]. [place]. Siga estas indicaciones en su casa: Si tiene una frula o un dispositivo ortopdico: Use la frula o el dispositivo ortopdico como se lo hayan indicado. Quteselos solo si el mdico lo autoriza. Controle [employer] piel a su alrededor. Informe al mdico si observa problemas. Afloje la frula o el dispositivo ortopdico si los dedos se le entumecen, siente hormigueo o se le enfran y se tornan de Research officer, trade union. Mantenga la frula o el dispositivo ortopdico limpios y secos. Si la frula o el dispositivo ortopdico no son impermeables: No deje que se mojen. Cbralos para ducharse o baarse. Use una cubierta que no permita que [place]. Control  del dolor, la rigidez y la hinchazn  Use hielo o una bolsa de hielo como se lo hayan indicado. Si tiene una frula o un dispositivo ortopdico que se pueden Development worker, community, quteselos como se lo hayan indicado. Coloque una toalla entre la piel y el  hielo. Coloque el hielo durante 20 minutos, 2 a 3 veces por da. Si la piel se le pone de color rojo, quite el hielo de inmediato para evitar daos en la piel. El [place] de dao es mayor si no puede sentir dolor, Airline pilot o fro. Mueva los dedos de la mano con frecuencia para reducir la rigidez y la hinchazn. Indicaciones generales Use los medicamentos nicamente segn las indicaciones. Descanse la [place] y la mano de cualquier actividad que le cause dolor. Si la causa del STC es algo que hace en el [place], hable con su empleador sobre hacer cambios. Por ejemplo, es posible que necesite usar una almohadilla para la mueca mientras escribe en el teclado. Haga ejercicio segn las indicaciones. Siga las instrucciones para hacer los ejercicios de deslizamiento o movilizacin de los nervios. Estos ayudan a que los nervios se muevan fluidamente dentro de los tejidos [employer] rodean. Concurra a todas las visitas de seguimiento. Esto es importante. Dnde obtener ms informacin [employer] of Orthopedic Surgeons (Academia Estadounidense de Cirujanos Ortopdicos): orthoinfo.aaos.[employer] of Neurological Disorders and Stroke (Instituto Nacional de Trastornos Neurolgicos y Accidentes Cerebrovasculares): BasicFM.no Comunquese con un mdico si: Aparecen nuevos sntomas. El dolor no se alivia con los [employer]. Los sntomas empeoran. Solicite ayuda de inmediato si: La mano o la mueca se le adormecen o siente hormigueos, y los sntomas se hacen muy intensos. Esta informacin no tiene Theme park manager el consejo del mdico. Asegrese de hacerle al mdico cualquier pregunta que tenga. Document Revised: 08/20/2023 Document Reviewed: 08/20/2023 Elsevier Patient Education  2024 [employer].

## 2024-03-31 ENCOUNTER — Encounter: Payer: Self-pay | Admitting: Physician Assistant

## 2024-03-31 ENCOUNTER — Other Ambulatory Visit (INDEPENDENT_AMBULATORY_CARE_PROVIDER_SITE_OTHER): Payer: Self-pay

## 2024-03-31 ENCOUNTER — Other Ambulatory Visit: Payer: Self-pay

## 2024-03-31 ENCOUNTER — Ambulatory Visit (INDEPENDENT_AMBULATORY_CARE_PROVIDER_SITE_OTHER): Payer: Self-pay | Admitting: Physician Assistant

## 2024-03-31 DIAGNOSIS — M25532 Pain in left wrist: Secondary | ICD-10-CM

## 2024-03-31 DIAGNOSIS — M25531 Pain in right wrist: Secondary | ICD-10-CM

## 2024-03-31 MED ORDER — MELOXICAM 15 MG PO TABS
15.0000 mg | ORAL_TABLET | Freq: Every day | ORAL | 0 refills | Status: AC
  Filled 2024-03-31: qty 30, 30d supply, fill #0

## 2024-03-31 NOTE — Progress Notes (Signed)
 Office Visit Note   Patient: Linda Duncan           Date of Birth: 04/15/1986           MRN: [id number] Visit Date: 03/31/2024              Requested by: Grayce Sessions, NP [address] [place],  [place] [address] PCP: Claiborne Rigg, NP   Assessment & Plan: Visit Diagnoses:  1. Pain in right wrist   2. 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  She does quite a bit of chopping and using her hands in the kitchen.  She has intermittent radial wrist pain.  Denies any injuries she says it is not bothering her too much today.  She does have a small cyst on the radial surface of her left wrist.  She takes Tylenol as needed.  X-rays are fairly normal today she does not really have much pain today.  Given now that she has this intermittent pain I talked her about taking meloxicam not to take any other anti-inflammatories with it.  We also discussed working with a therapist perhaps they could make some wrist supports for her.  Will see how she is doing in the next month.  Patient was seen with the help of a Spanish interpreter  Follow-Up Instructions: Return in about 1 month (around 04/30/2024).   Orders:  Orders Placed This Encounter  Procedures   XR Wrist Complete Right   XR Wrist Complete Left   Meds ordered this encounter  Medications   meloxicam (MOBIC) 15 MG tablet    Sig: Take 1 tablet (15 mg total) by mouth daily.    Dispense:  30 tablet    Refill:  0      Procedures: No procedures performed   Clinical Data: No additional findings.   Subjective: Chief Complaint  Patient presents with   Right Wrist - Pain    Pain in the wrist with a knot that forms and hurts at times    Left Wrist - Pain    Pain in the wrist with a knot that forms and hurts at times     HPI pleasant 38 year old woman right-hand-dominant however uses both her hands quite extensively as she works as a Financial risk analyst.  No injury but she has had  intermittent pain in the base of her wrist.  No pain is mostly on the radial volar surface of the wrist.  Her strength is intact.  She denies any numbness  Review of Systems  All other systems reviewed and are negative.    Objective: Vital Signs: There were no vitals taken for this visit.  Physical Exam Constitutional:      Appearance: Normal appearance.  Pulmonary:     Effort: Pulmonary effort is normal.  Skin:    General: Skin is warm and dry.  Neurological:     General: No focal deficit present.     Mental Status: She is alert and oriented to person, place, and time.     Ortho Exam Examination of her wrist she has strong radial pulses small radial volar cyst is palpable slightly tender on the left wrist.  She has good strength with abduction and abduction does not reproduce pain with extension and flexion of the wrist today.  Negative Tinel's sign Specialty Comments:  No specialty comments available.  Imaging: XR Wrist Complete Left Result Date: 03/31/2024 Radiographs  of her left wrist no arthropathy well-maintained alignment no evidence of fracture  XR Wrist Complete Right Result Date: 03/31/2024 Radiographs of the right wrist no evidence of any arthropathy well-maintained alignment    PMFS History: Patient Active Problem List   Diagnosis Date Noted   Chronic paronychia of right thumb 01/26/2022   Menorrhagia with irregular cycle 08/28/2016   Chronic venous insufficiency 05/25/2016   Varicose veins of right lower extremity with complications 02/24/2016   Dyspareunia 03/25/2014   Past Medical History:  Diagnosis Date   Medical history non-contributory    Varicose vein of leg     Family History  Problem Relation Age of Onset   Healthy Mother    Healthy Father    Hypertension Neg Hx    Diabetes Neg Hx     Past Surgical History:  Procedure Laterality Date   CHOLECYSTECTOMY     I & D EXTREMITY Left 03/07/2023   Procedure: IRRIGATION AND DEBRIDEMENT OF LEFT  INDEX FINGER;  Surgeon: Brunilda Capra, MD;  Location: [facility];  Service: Orthopedics;  Laterality: Left;   URETHRAL CYST REMOVAL N/A 08/17/2014   Procedure: RESECTION OF SUB- URETHRAL CYST;  Surgeon: Lenord Radon, MD;  Location: [facility];  Service: Gynecology;  Laterality: N/A;   VARICOSE VEIN SURGERY     Social History   Occupational History   Not on file  Tobacco Use   Smoking status: Never   Smokeless tobacco: Never  Vaping Use   Vaping status: Never Used  Substance and Sexual Activity   Alcohol use: No   Drug use: No   Sexual activity: Yes    Birth control/protection: Implant

## 2024-04-03 ENCOUNTER — Other Ambulatory Visit: Payer: Self-pay

## 2024-04-28 ENCOUNTER — Ambulatory Visit: Payer: Self-pay | Admitting: Physician Assistant
